# Patient Record
Sex: Male | Born: 1935 | Race: White | Hispanic: No | Smoking: Never smoker
Health system: Southern US, Community
[De-identification: ages and names within clinical notes are randomized; demographics above are authoritative.]

## PROBLEM LIST (undated history)

## (undated) DIAGNOSIS — K5900 Constipation, unspecified: Secondary | ICD-10-CM

## (undated) DIAGNOSIS — T4145XA Adverse effect of unspecified anesthetic, initial encounter: Secondary | ICD-10-CM

## (undated) DIAGNOSIS — K4090 Unilateral inguinal hernia, without obstruction or gangrene, not specified as recurrent: Secondary | ICD-10-CM

## (undated) DIAGNOSIS — D649 Anemia, unspecified: Secondary | ICD-10-CM

## (undated) DIAGNOSIS — N289 Disorder of kidney and ureter, unspecified: Secondary | ICD-10-CM

## (undated) DIAGNOSIS — H409 Unspecified glaucoma: Secondary | ICD-10-CM

## (undated) DIAGNOSIS — R32 Unspecified urinary incontinence: Secondary | ICD-10-CM

## (undated) DIAGNOSIS — R609 Edema, unspecified: Secondary | ICD-10-CM

## (undated) DIAGNOSIS — T8859XA Other complications of anesthesia, initial encounter: Secondary | ICD-10-CM

## (undated) DIAGNOSIS — I639 Cerebral infarction, unspecified: Secondary | ICD-10-CM

## (undated) DIAGNOSIS — I739 Peripheral vascular disease, unspecified: Secondary | ICD-10-CM

## (undated) DIAGNOSIS — C801 Malignant (primary) neoplasm, unspecified: Secondary | ICD-10-CM

## (undated) HISTORY — PX: PENILE PROSTHESIS IMPLANT: SHX240

## (undated) HISTORY — PX: HERNIA REPAIR: SHX51

## (undated) HISTORY — PX: CAROTID ENDARTERECTOMY: SUR193

## (undated) HISTORY — PX: PROSTATECTOMY: SHX69

---

## 2005-03-21 HISTORY — PX: PENILE PROSTHESIS  REMOVAL: SHX2202

## 2017-01-31 ENCOUNTER — Inpatient Hospital Stay (HOSPITAL_COMMUNITY)
Admission: EM | Admit: 2017-01-31 | Discharge: 2017-02-02 | DRG: 682 | Disposition: A | Discharge status: Hospice | Payer: Medicare Other | Attending: Internal Medicine | Admitting: Internal Medicine

## 2017-01-31 ENCOUNTER — Emergency Department (HOSPITAL_COMMUNITY): Payer: Medicare Other

## 2017-01-31 ENCOUNTER — Inpatient Hospital Stay (HOSPITAL_COMMUNITY): Payer: Medicare Other

## 2017-01-31 ENCOUNTER — Encounter (HOSPITAL_COMMUNITY): Payer: Self-pay | Admitting: Emergency Medicine

## 2017-01-31 DIAGNOSIS — Z515 Encounter for palliative care: Secondary | ICD-10-CM

## 2017-01-31 DIAGNOSIS — E875 Hyperkalemia: Secondary | ICD-10-CM | POA: Diagnosis present

## 2017-01-31 DIAGNOSIS — Z888 Allergy status to other drugs, medicaments and biological substances status: Secondary | ICD-10-CM

## 2017-01-31 DIAGNOSIS — M7989 Other specified soft tissue disorders: Secondary | ICD-10-CM | POA: Diagnosis present

## 2017-01-31 DIAGNOSIS — I69354 Hemiplegia and hemiparesis following cerebral infarction affecting left non-dominant side: Secondary | ICD-10-CM | POA: Diagnosis not present

## 2017-01-31 DIAGNOSIS — D696 Thrombocytopenia, unspecified: Secondary | ICD-10-CM | POA: Diagnosis present

## 2017-01-31 DIAGNOSIS — Z66 Do not resuscitate: Secondary | ICD-10-CM | POA: Diagnosis present

## 2017-01-31 DIAGNOSIS — R600 Localized edema: Secondary | ICD-10-CM | POA: Diagnosis present

## 2017-01-31 DIAGNOSIS — Z7189 Other specified counseling: Secondary | ICD-10-CM

## 2017-01-31 DIAGNOSIS — S2242XD Multiple fractures of ribs, left side, subsequent encounter for fracture with routine healing: Secondary | ICD-10-CM | POA: Diagnosis not present

## 2017-01-31 DIAGNOSIS — N183 Chronic kidney disease, stage 3 (moderate): Secondary | ICD-10-CM | POA: Diagnosis present

## 2017-01-31 DIAGNOSIS — N170 Acute kidney failure with tubular necrosis: Secondary | ICD-10-CM | POA: Diagnosis present

## 2017-01-31 DIAGNOSIS — R402132 Coma scale, eyes open, to sound, at arrival to emergency department: Secondary | ICD-10-CM | POA: Diagnosis present

## 2017-01-31 DIAGNOSIS — I639 Cerebral infarction, unspecified: Secondary | ICD-10-CM | POA: Diagnosis not present

## 2017-01-31 DIAGNOSIS — Z9079 Acquired absence of other genital organ(s): Secondary | ICD-10-CM

## 2017-01-31 DIAGNOSIS — R402242 Coma scale, best verbal response, confused conversation, at arrival to emergency department: Secondary | ICD-10-CM | POA: Diagnosis present

## 2017-01-31 DIAGNOSIS — I6340 Cerebral infarction due to embolism of unspecified cerebral artery: Secondary | ICD-10-CM | POA: Diagnosis present

## 2017-01-31 DIAGNOSIS — H409 Unspecified glaucoma: Secondary | ICD-10-CM | POA: Diagnosis present

## 2017-01-31 DIAGNOSIS — K5900 Constipation, unspecified: Secondary | ICD-10-CM | POA: Diagnosis present

## 2017-01-31 DIAGNOSIS — I129 Hypertensive chronic kidney disease with stage 1 through stage 4 chronic kidney disease, or unspecified chronic kidney disease: Secondary | ICD-10-CM | POA: Diagnosis present

## 2017-01-31 DIAGNOSIS — R402362 Coma scale, best motor response, obeys commands, at arrival to emergency department: Secondary | ICD-10-CM | POA: Diagnosis present

## 2017-01-31 DIAGNOSIS — Z7902 Long term (current) use of antithrombotics/antiplatelets: Secondary | ICD-10-CM

## 2017-01-31 DIAGNOSIS — E785 Hyperlipidemia, unspecified: Secondary | ICD-10-CM | POA: Diagnosis present

## 2017-01-31 DIAGNOSIS — F0390 Unspecified dementia without behavioral disturbance: Secondary | ICD-10-CM | POA: Diagnosis present

## 2017-01-31 DIAGNOSIS — I739 Peripheral vascular disease, unspecified: Secondary | ICD-10-CM | POA: Diagnosis present

## 2017-01-31 DIAGNOSIS — Z885 Allergy status to narcotic agent status: Secondary | ICD-10-CM

## 2017-01-31 DIAGNOSIS — Z8249 Family history of ischemic heart disease and other diseases of the circulatory system: Secondary | ICD-10-CM | POA: Diagnosis not present

## 2017-01-31 DIAGNOSIS — Z8673 Personal history of transient ischemic attack (TIA), and cerebral infarction without residual deficits: Secondary | ICD-10-CM

## 2017-01-31 DIAGNOSIS — Z8546 Personal history of malignant neoplasm of prostate: Secondary | ICD-10-CM | POA: Diagnosis not present

## 2017-01-31 DIAGNOSIS — Z886 Allergy status to analgesic agent status: Secondary | ICD-10-CM

## 2017-01-31 DIAGNOSIS — W19XXXD Unspecified fall, subsequent encounter: Secondary | ICD-10-CM | POA: Diagnosis present

## 2017-01-31 DIAGNOSIS — R944 Abnormal results of kidney function studies: Secondary | ICD-10-CM | POA: Diagnosis present

## 2017-01-31 DIAGNOSIS — I10 Essential (primary) hypertension: Secondary | ICD-10-CM | POA: Diagnosis not present

## 2017-01-31 DIAGNOSIS — N179 Acute kidney failure, unspecified: Secondary | ICD-10-CM | POA: Diagnosis present

## 2017-01-31 HISTORY — DX: Unilateral inguinal hernia, without obstruction or gangrene, not specified as recurrent: K40.90

## 2017-01-31 HISTORY — DX: Constipation, unspecified: K59.00

## 2017-01-31 HISTORY — DX: Unspecified glaucoma: H40.9

## 2017-01-31 HISTORY — DX: Anemia, unspecified: D64.9

## 2017-01-31 HISTORY — DX: Adverse effect of unspecified anesthetic, initial encounter: T41.45XA

## 2017-01-31 HISTORY — DX: Edema, unspecified: R60.9

## 2017-01-31 HISTORY — DX: Unspecified urinary incontinence: R32

## 2017-01-31 HISTORY — DX: Disorder of kidney and ureter, unspecified: N28.9

## 2017-01-31 HISTORY — DX: Cerebral infarction, unspecified: I63.9

## 2017-01-31 HISTORY — DX: Peripheral vascular disease, unspecified: I73.9

## 2017-01-31 HISTORY — DX: Malignant (primary) neoplasm, unspecified: C80.1

## 2017-01-31 HISTORY — DX: Other complications of anesthesia, initial encounter: T88.59XA

## 2017-01-31 LAB — CBC WITH DIFFERENTIAL/PLATELET
Basophils Absolute: 0 10*3/uL (ref 0.0–0.1)
Basophils Relative: 0 %
EOS PCT: 1 %
Eosinophils Absolute: 0.1 10*3/uL (ref 0.0–0.7)
HCT: 39.5 % (ref 39.0–52.0)
Hemoglobin: 13.5 g/dL (ref 13.0–17.0)
LYMPHS ABS: 1 10*3/uL (ref 0.7–4.0)
LYMPHS PCT: 7 %
MCH: 32.7 pg (ref 26.0–34.0)
MCHC: 34.2 g/dL (ref 30.0–36.0)
MCV: 95.6 fL (ref 78.0–100.0)
MONO ABS: 2 10*3/uL — AB (ref 0.1–1.0)
MONOS PCT: 13 %
Neutro Abs: 12.5 10*3/uL — ABNORMAL HIGH (ref 1.7–7.7)
Neutrophils Relative %: 80 %
PLATELETS: 76 10*3/uL — AB (ref 150–400)
RBC: 4.13 MIL/uL — ABNORMAL LOW (ref 4.22–5.81)
RDW: 14.2 % (ref 11.5–15.5)
WBC: 15.6 10*3/uL — ABNORMAL HIGH (ref 4.0–10.5)

## 2017-01-31 LAB — URINALYSIS, ROUTINE W REFLEX MICROSCOPIC
BACTERIA UA: NONE SEEN
BILIRUBIN URINE: NEGATIVE
Glucose, UA: NEGATIVE mg/dL
Ketones, ur: NEGATIVE mg/dL
Leukocytes, UA: NEGATIVE
Nitrite: NEGATIVE
PROTEIN: 100 mg/dL — AB
SPECIFIC GRAVITY, URINE: 1.01 (ref 1.005–1.030)
pH: 7 (ref 5.0–8.0)

## 2017-01-31 LAB — I-STAT CHEM 8, ED
BUN: 118 mg/dL — ABNORMAL HIGH (ref 6–20)
CALCIUM ION: 1.07 mmol/L — AB (ref 1.15–1.40)
CREATININE: 11.1 mg/dL — AB (ref 0.61–1.24)
Chloride: 100 mmol/L — ABNORMAL LOW (ref 101–111)
GLUCOSE: 109 mg/dL — AB (ref 65–99)
HCT: 40 % (ref 39.0–52.0)
HEMOGLOBIN: 13.6 g/dL (ref 13.0–17.0)
Potassium: 6 mmol/L — ABNORMAL HIGH (ref 3.5–5.1)
Sodium: 134 mmol/L — ABNORMAL LOW (ref 135–145)
TCO2: 25 mmol/L (ref 22–32)

## 2017-01-31 LAB — LACTATE DEHYDROGENASE: LDH: 917 U/L — ABNORMAL HIGH (ref 98–192)

## 2017-01-31 LAB — COMPREHENSIVE METABOLIC PANEL
ALT: 36 U/L (ref 17–63)
AST: 45 U/L — AB (ref 15–41)
Albumin: 3.3 g/dL — ABNORMAL LOW (ref 3.5–5.0)
Alkaline Phosphatase: 99 U/L (ref 38–126)
Anion gap: 14 (ref 5–15)
BILIRUBIN TOTAL: 1.9 mg/dL — AB (ref 0.3–1.2)
BUN: 121 mg/dL — AB (ref 6–20)
CHLORIDE: 96 mmol/L — AB (ref 101–111)
CO2: 24 mmol/L (ref 22–32)
Calcium: 8.6 mg/dL — ABNORMAL LOW (ref 8.9–10.3)
Creatinine, Ser: 10.68 mg/dL — ABNORMAL HIGH (ref 0.61–1.24)
GFR calc Af Amer: 4 mL/min — ABNORMAL LOW (ref >60)
GFR calc non Af Amer: 4 mL/min — ABNORMAL LOW (ref >60)
GLUCOSE: 110 mg/dL — AB (ref 65–99)
POTASSIUM: 6.2 mmol/L — AB (ref 3.5–5.1)
Sodium: 134 mmol/L — ABNORMAL LOW (ref 135–145)
Total Protein: 6.4 g/dL — ABNORMAL LOW (ref 6.5–8.1)

## 2017-01-31 LAB — CBG MONITORING, ED: GLUCOSE-CAPILLARY: 69 mg/dL (ref 65–99)

## 2017-01-31 LAB — PROTIME-INR
INR: 1.11
PROTHROMBIN TIME: 14.2 s (ref 11.4–15.2)

## 2017-01-31 LAB — URIC ACID: URIC ACID, SERUM: 8.9 mg/dL — AB (ref 4.4–7.6)

## 2017-01-31 LAB — APTT: APTT: 30 s (ref 24–36)

## 2017-01-31 LAB — CK: CK TOTAL: 268 U/L (ref 49–397)

## 2017-01-31 MED ORDER — TRAVOPROST 0.004 % OP SOLN: 1.0000 [drp] | Freq: Every day | OPHTHALMIC | Status: DC

## 2017-01-31 MED ORDER — ATORVASTATIN CALCIUM 20 MG PO TABS
20.0000 mg | ORAL_TABLET | Freq: Every day | ORAL | Status: DC
  Administered 2017-02-01 – 2017-02-02 (×2): 20 mg via ORAL
  Filled 2017-01-31 (×2): qty 1

## 2017-01-31 MED ORDER — ACETAMINOPHEN 160 MG/5ML PO SOLN: 650.0000 mg | ORAL | Status: DC | PRN

## 2017-01-31 MED ORDER — ACETAMINOPHEN 650 MG RE SUPP: 650.0000 mg | RECTAL | Status: DC | PRN

## 2017-01-31 MED ORDER — SODIUM CHLORIDE 0.9 % IV BOLUS (SEPSIS)
1000.0000 mL | Freq: Once | INTRAVENOUS | Status: AC
  Administered 2017-01-31: 1000 mL via INTRAVENOUS

## 2017-01-31 MED ORDER — ACETAMINOPHEN 325 MG PO TABS
650.0000 mg | ORAL_TABLET | ORAL | Status: DC | PRN
  Administered 2017-02-01: 650 mg via ORAL
  Filled 2017-01-31: qty 2

## 2017-01-31 MED ORDER — EZETIMIBE 10 MG PO TABS
10.0000 mg | ORAL_TABLET | Freq: Every day | ORAL | Status: DC
Start: 2017-02-01 — End: 2017-02-02
  Administered 2017-02-01 – 2017-02-02 (×2): 10 mg via ORAL
  Filled 2017-01-31 (×2): qty 1

## 2017-01-31 MED ORDER — SODIUM CHLORIDE 0.9 % IV SOLN
1.0000 g | Freq: Once | INTRAVENOUS | Status: AC
  Administered 2017-01-31: 1 g via INTRAVENOUS
  Filled 2017-01-31: qty 10

## 2017-01-31 MED ORDER — SODIUM CHLORIDE 0.9 % IV SOLN
INTRAVENOUS | Status: DC
  Administered 2017-02-01 (×2): via INTRAVENOUS

## 2017-01-31 MED ORDER — SODIUM POLYSTYRENE SULFONATE 15 GM/60ML PO SUSP
50.0000 g | Freq: Once | ORAL | Status: AC
  Administered 2017-01-31: 50 g via RECTAL
  Filled 2017-01-31: qty 240

## 2017-01-31 MED ORDER — CLOPIDOGREL BISULFATE 75 MG PO TABS
75.0000 mg | ORAL_TABLET | Freq: Every day | ORAL | Status: DC
  Administered 2017-02-01: 75 mg via ORAL
  Filled 2017-01-31: qty 1

## 2017-01-31 MED ORDER — DEXTROSE 50 % IV SOLN
50.0000 mL | Freq: Once | INTRAVENOUS | Status: AC
  Administered 2017-01-31: 50 mL via INTRAVENOUS
  Filled 2017-01-31: qty 50

## 2017-01-31 MED ORDER — AMLODIPINE BESYLATE 5 MG PO TABS
2.5000 mg | ORAL_TABLET | Freq: Every day | ORAL | Status: DC
  Administered 2017-02-01 – 2017-02-02 (×2): 2.5 mg via ORAL
  Filled 2017-01-31 (×2): qty 1

## 2017-01-31 NOTE — ED Notes (Signed)
Call report to [facility]  [phone] at [phone]

## 2017-01-31 NOTE — Consult Note (Signed)
Reason for Consult:AKI Referring Physician: Dr. Annett Fabian Sr. is an 83 y.o. male.  HPI: 83 yr male with long hx HTN, Hx prostate Ca, CVA 3.18, glaucoma, incontinece, dementia who lives at [facility] in skilled care.  Baseline Cr 3/18 1.4s.  MEDS?? List not available and son does not know or the patient know.  Fall on Sat and seen in [facility] ED and released.  Apparently weak since, not walking with usual walker. Son says eating fine, maybe a little more confusion.  No N, V, shaking,poor intake, rash, or arthritis pills. By reports more confused so brought to ED earlier.  Found to have Cr 11/BUN 118/K of 6 /normal bicarb. No FH renal dz, no blood in urine known.  CT without obstruction, has foley with no urine.  Stranding around kidneys. Review of systems not obtained due to patient factors. Dementia  Past Medical History:  Diagnosis Date  . Anemia   . Cancer [facility])    Prostate  . Complication of anesthesia    trouble with anethesia when had prostate ca surgery - problem unknown  . Constipation   . Edema   . Glaucoma   . Incontinence   . Inguinal hernia   . Peripheral vascular disease ([place])   . Renal disorder    Stage III  . Stroke [facility])    left sided weakness    Past Surgical History:  Procedure Laterality Date  . CAROTID ENDARTERECTOMY Left approx. 2007  . HERNIA REPAIR  approx. 1996   inguinal hernia  . PENILE PROSTHESIS  REMOVAL  2007  . PENILE PROSTHESIS IMPLANT  approx. 1979  . PROSTATECTOMY  approx. 1971    History reviewed. No pertinent family history.  Social History:  reports that  has never smoked. he has never used smokeless tobacco. He reports that he does not drink alcohol or use drugs.  Allergies:  Allergies  Allergen Reactions  . Demerol [Meperidine]   . Nsaids   . Simvastatin   . Statins     Medications: not available  Results for orders placed or performed during the hospital encounter of 01/31/17 (from the past 48 hour(s))   Comprehensive metabolic panel     Status: Abnormal   Collection Time: 01/31/17  7:09 PM  Result Value Ref Range   Sodium 134 (L) 135 - 145 mmol/L   Potassium 6.2 (H) 3.5 - 5.1 mmol/L   Chloride 96 (L) 101 - 111 mmol/L   CO2 24 22 - 32 mmol/L   Glucose, Bld 110 (H) 65 - 99 mg/dL   BUN 121 (H) 6 - 20 mg/dL    Comment: RESULTS CONFIRMED BY MANUAL DILUTION   Creatinine, Ser 10.68 (H) 0.61 - 1.24 mg/dL   Calcium 8.6 (L) 8.9 - 10.3 mg/dL   Total Protein 6.4 (L) 6.5 - 8.1 g/dL   Albumin 3.3 (L) 3.5 - 5.0 g/dL   AST 45 (H) 15 - 41 U/L   ALT 36 17 - 63 U/L   Alkaline Phosphatase 99 38 - 126 U/L   Total Bilirubin 1.9 (H) 0.3 - 1.2 mg/dL   GFR calc non Af Amer 4 (L) >60 mL/min   GFR calc Af Amer 4 (L) >60 mL/min    Comment: (NOTE) The eGFR has been calculated using the CKD EPI equation. This calculation has not been validated in all clinical situations. eGFR's persistently <60 mL/min signify possible Chronic Kidney Disease.    Anion gap 14 5 - 15  CBC with Differential  Status: Abnormal   Collection Time: 01/31/17  7:09 PM  Result Value Ref Range   WBC 15.6 (H) 4.0 - 10.5 K/uL   RBC 4.13 (L) 4.22 - 5.81 MIL/uL   Hemoglobin 13.5 13.0 - 17.0 g/dL   HCT 39.5 39.0 - 52.0 %   MCV 95.6 78.0 - 100.0 fL   MCH 32.7 26.0 - 34.0 pg   MCHC 34.2 30.0 - 36.0 g/dL   RDW 14.2 11.5 - 15.5 %   Platelets 76 (L) 150 - 400 K/uL    Comment: REPEATED TO VERIFY SPECIMEN CHECKED FOR CLOTS PLATELET COUNT CONFIRMED BY SMEAR    Neutrophils Relative % 80 %   Neutro Abs 12.5 (H) 1.7 - 7.7 K/uL   Lymphocytes Relative 7 %   Lymphs Abs 1.0 0.7 - 4.0 K/uL   Monocytes Relative 13 %   Monocytes Absolute 2.0 (H) 0.1 - 1.0 K/uL   Eosinophils Relative 1 %   Eosinophils Absolute 0.1 0.0 - 0.7 K/uL   Basophils Relative 0 %   Basophils Absolute 0.0 0.0 - 0.1 K/uL  Urinalysis, Routine w reflex microscopic     Status: Abnormal   Collection Time: 01/31/17  7:13 PM  Result Value Ref Range   Color, Urine YELLOW  YELLOW   APPearance CLEAR CLEAR   Specific Gravity, Urine 1.010 1.005 - 1.030   pH 7.0 5.0 - 8.0   Glucose, UA NEGATIVE NEGATIVE mg/dL   Hgb urine dipstick LARGE (A) NEGATIVE   Bilirubin Urine NEGATIVE NEGATIVE   Ketones, ur NEGATIVE NEGATIVE mg/dL   Protein, ur 100 (A) NEGATIVE mg/dL   Nitrite NEGATIVE NEGATIVE   Leukocytes, UA NEGATIVE NEGATIVE   RBC / HPF TOO NUMEROUS TO COUNT 0 - 5 RBC/hpf   WBC, UA 6-30 0 - 5 WBC/hpf   Bacteria, UA NONE SEEN NONE SEEN   Squamous Epithelial / LPF 0-5 (A) NONE SEEN   Mucus PRESENT    Ca Oxalate Crys, UA PRESENT   I-stat Chem 8, ED     Status: Abnormal   Collection Time: 01/31/17  7:50 PM  Result Value Ref Range   Sodium 134 (L) 135 - 145 mmol/L   Potassium 6.0 (H) 3.5 - 5.1 mmol/L   Chloride 100 (L) 101 - 111 mmol/L   BUN 118 (H) 6 - 20 mg/dL   Creatinine, Ser 11.10 (H) 0.61 - 1.24 mg/dL   Glucose, Bld 109 (H) 65 - 99 mg/dL   Calcium, Ion 1.07 (L) 1.15 - 1.40 mmol/L   TCO2 25 22 - 32 mmol/L   Hemoglobin 13.6 13.0 - 17.0 g/dL   HCT 40.0 39.0 - 52.0 %  CBG monitoring, ED     Status: None   Collection Time: 01/31/17  8:31 PM  Result Value Ref Range   Glucose-Capillary 69 65 - 99 mg/dL  CK     Status: None   Collection Time: 01/31/17  8:46 PM  Result Value Ref Range   Total CK 268 49 - 397 U/L    Ct Abdomen Pelvis Wo Contrast  Result Date: 01/31/2017 CLINICAL DATA:  Patient status post fall on Saturday was diagnosed with kidney contusion. EXAM: CT ABDOMEN AND PELVIS WITHOUT CONTRAST TECHNIQUE: Multidetector CT imaging of the abdomen and pelvis was performed following the standard protocol without IV contrast. COMPARISON:  None. FINDINGS: Lower chest: Mild atelectasis of posterior lung bases are noted. The heart size is mildly enlarged. Hepatobiliary: No focal liver abnormality is seen. No gallstones, gallbladder wall thickening, or biliary dilatation. Pancreas:  There is a question low-density lesion measuring 1.3 cm in the tail of pancreas.  Pancreas is otherwise unremarkable. Spleen: Normal in size without focal abnormality. Adrenals/Urinary Tract: The adrenal glands are normal. No focal lesions identified in the kidneys. There is no hydronephrosis bilaterally. Mild bilateral perinephric stranding are identified probably chronic. No definite focal hematoma is identified surrounding the kidneys. The bladder is decompressed with a Foley catheter in place. Stomach/Bowel: There is a moderate hiatal hernia. The stomach is otherwise normal. There is no small bowel obstruction. The colon is normal. The appendix is normal. Vascular/Lymphatic: Aortic atherosclerosis. No enlarged abdominal or pelvic lymph nodes. Reproductive: Prostate is unremarkable. Other: None. Musculoskeletal: Degenerative joint changes of the spine are noted. Scoliosis of spine are noted. Chronic deformity of the pelvic bones are identified. IMPRESSION: No acute abnormality identified in the abdomen pelvis. There is no evidence of hematoma surrounding the bilateral kidneys. Mild chronic bilateral prior nephric stranding are noted. Question 1.3 cm low-density lesion in the tail pancreas. Moderate hiatal hernia. Electronically Signed   By: Abelardo Diesel M.D.   On: 01/31/2017 20:19   Dg Ribs Unilateral W/chest Left  Result Date: 01/31/2017 CLINICAL DATA:  Altered mental status after a fall 3 days ago. Left-sided chest pain. EXAM: LEFT RIBS AND CHEST - 3+ VIEW COMPARISON:  None. FINDINGS: Left ventricular prominence. Aortic atherosclerosis with calcification and tortuosity. No evidence of heart failure, infiltrate, collapse or effusion. No pneumothorax. Left-sided rib films show what appear to be old healed fractures at ribs 4, 5, 6, 7 and 8 posteriorly. Cannot completely rule out a more recent injury, but largely these look old. No definite acute fracture. IMPRESSION: Burtis Junes old healed rib fractures on the left from ribs 4 through 8. Difficult to completely rule out acute injury, but favor  that these are old. Electronically Signed   By: Nelson Chimes M.D.   On: 01/31/2017 20:14   Ct Head Wo Contrast  Result Date: 01/31/2017 CLINICAL DATA:  Status post fall on Saturday with altered mental status. EXAM: CT HEAD WITHOUT CONTRAST TECHNIQUE: Contiguous axial images were obtained from the base of the skull through the vertex without intravenous contrast. COMPARISON:  None. FINDINGS: Brain: No evidence of acute infarction, hemorrhage, hydrocephalus, extra-axial collection or mass lesion/mass effect. There is chronic diffuse atrophy. Chronic bilateral periventricular white matter small vessel ischemic changes identified. There are old infarcts in bilateral basal ganglia, right occipital lobe, and right cerebellum. Vascular: No hyperdense vessel or unexpected calcification. Skull: Normal. Negative for fracture or focal lesion. Sinuses/Orbits: There is small left maxillary sinus retention cyst. The orbits are normal. Other: None. IMPRESSION: No focal acute intracranial abnormality identified. Old infarcts in bilateral basal ganglia, right occipital lobe, and right cerebellum. Chronic diffuse atrophy. Chronic bilateral periventricular white matter small vessel ischemic changes. Electronically Signed   By: Abelardo Diesel M.D.   On: 01/31/2017 20:10    ROS Blood pressure (!) 179/115, pulse 76, temperature 98.7 F (37.1 C), temperature source Rectal, resp. rate 19, SpO2 94 %. Physical Exam Physical Examination: General appearance - pale, tremulous Mental status - oriented to person, not place or time, conversant and knows some hx Eyes - pupils equal and reactive, extraocular eye movements intact, funduscopic exam normal, discs flat and sharp Ears - bilateral TM's and external ear canals normal Mouth - mucous membranes moist, pharynx normal without lesions Neck - adenopathy noted PCL Lymphatics - posterior cervical nodes Chest - scattered rhonchi Heart - S1 and S2 normal, systolic murmur CX4/4 at  2nd left intercostal space Abdomen - pos bs, bruise on L , liver down 4 cm, soft Extremities - support stocking L leg, DP 1+, Tr edema,  Skin - normal coloration and turgor, no rashes, no suspicious skin lesions noted  Assessment/Plan: 1 AKI suspect subacute.  No obstruct on CT but ??prostate Ca related.  Most prominent in differential dx, toxic related to meds, AIN, AGN, or TMA (low platelets), or dysproteinemia.  No clear cut reason for ATN.  Need U/S, serologies, Protein electrophoreses, Coags, LDH, .  Mod uremia, ^ K , acid/base ok.  Son not sure wants RRT at all, and wants conservative measures while considers. Not candidate long term at all. 2 CKD 3 suspect HTN 3 Hypertension: mod 4. Anemia not an issue 5. Metabolic Bone Disease no indic to eval 6 CVA with L sided weakness chronic 7 Dementia P IV NS 100cc/h, Urine chem, Protein electrophoreses, serologies, coags, LDH, Kayex  Raney Antwine,Juma L 01/31/2017, 10:04 PM

## 2017-01-31 NOTE — ED Notes (Signed)
Bladder scan done zero amount see. M.d aware and try with u/s and it was very difficult to see urine.

## 2017-01-31 NOTE — ED Notes (Signed)
Ultrasound on going. 

## 2017-01-31 NOTE — H&P (Signed)
History and Physical    George Venable Sr. ZOX:[id number] DOB: 01-09-1922 DOA: 01/31/2017  PCP: Patient, No Pcp Per  Patient coming from: Skilled nursing facility.  Chief Complaint: Abnormal labs.  HPI: George Benningfield Sr. is a 83 y.o. male with history of hypertension, prostate cancer, stroke in March of this year which left patient with mild left-sided weakness and at that time follow-up with cardiologist showed unremarkable echocardiogram had a fall 2 days ago and was taken to [facility].  As per the son who provided most of the history CT head was done and discharged back to skilled nursing facility.  Since discharge patient has been feeling the and also has been having more weakness on the left upper extremity than usual.  Labs were done in the nursing home which showed markedly increased creatinine of 9.9 and patient's creatinine was 1.6 in March of this year.  Patient was referred to the ER.  ED Course: In the ER patient's repeat labs show elevated creatinine 10.6 with peaked T waves and hyperkalemia of 6.2.  Platelets were also decreased at around 70.  Patient was alert awake and following commands.  X-ray of the ribs show no fracture on 428 which is age-indeterminate.  CT of the abdomen does not show any obstruction.  CT head was unremarkable.  On exam patient does have left upper extremity weakness.  Nephrologist on-call was consulted.  Patient was given fluid bolus.  For hyperkalemia patient was given calcium gluconate Kayexalate.  Review of Systems: As per HPI, rest all negative.   Past Medical History:  Diagnosis Date  . Anemia   . Cancer [facility]    Prostate  . Complication of anesthesia    trouble with anethesia when had prostate ca surgery - problem unknown  . Constipation   . Edema   . Glaucoma   . Incontinence   . Inguinal hernia   . Peripheral vascular disease ([place])   . Renal disorder    Stage III  . Stroke [facility])    left sided weakness     Past Surgical History:  Procedure Laterality Date  . CAROTID ENDARTERECTOMY Left approx. 2007  . HERNIA REPAIR  approx. 1996   inguinal hernia  . PENILE PROSTHESIS  REMOVAL  2007  . PENILE PROSTHESIS IMPLANT  approx. 1979  . PROSTATECTOMY  approx. 1971     reports that  has never smoked. he has never used smokeless tobacco. He reports that he does not drink alcohol or use drugs.  Allergies  Allergen Reactions  . Demerol [Meperidine]   . Nsaids   . Simvastatin   . Statins     Family History  Problem Relation Age of Onset  . Hypertension Other     Prior to Admission medications   Medication Sig Start Date End Date Taking? Authorizing Provider  acetaminophen (TYLENOL) 325 MG tablet Take 650 mg every 4 (four) hours as needed by mouth for fever.    Yes [provider]  amLODipine (NORVASC) 2.5 MG tablet Take 2.5 mg daily by mouth.    Yes [provider]  ascorbic acid (VITAMIN C) 250 MG tablet Take 250 mg daily by mouth.    Yes [provider]  atorvastatin (LIPITOR) 20 MG tablet Take 20 mg daily by mouth.   Yes [provider]  calcium carbonate (OSCAL) 1500 (600 Ca) MG TABS tablet Take 1,500 mg daily with breakfast by mouth.   Yes [provider]  clopidogrel (PLAVIX) 75  MG tablet Take 75 mg daily by mouth.  05/27/16  Yes [provider]  Coenzyme Q-10 200 MG CAPS Take 200 mg daily by mouth.    Yes [provider]  ezetimibe (ZETIA) 10 MG tablet Take 10 mg by mouth. 02/18/16  Yes [provider]  gabapentin (NEURONTIN) 300 MG capsule Take 300-600 mg 2 (two) times daily by mouth. Take 2 capsules (600 mg) by mouth at midday and Take 1 capsule (300 mg) at bedtime   Yes [provider]  Nyoka Cowden Tea 150 MG CAPS Take 315 mg daily by mouth.    Yes [provider]  Multiple Vitamin (MULTI-VITAMINS) TABS Take 1 tablet daily by mouth.  01/18/16  Yes [provider]  polyethylene glycol  (MIRALAX / GLYCOLAX) packet Take 17 g daily by mouth.    Yes [provider]  primidone (MYSOLINE) 50 MG tablet Take 50 mg 2 (two) times daily by mouth.    Yes [provider]  sodium chloride (OCEAN) 0.65 % nasal spray 1 spray by Right Nare route as needed for congestion.   Yes [provider]  travoprost, benzalkonium, (TRAVATAN) 0.004 % ophthalmic solution Administer 1 drop to both eyes nightly.   Yes [provider]  zinc oxide 20 % ointment Apply 1 application 2 (two) times daily as needed topically for irritation.    Yes [provider]    Physical Exam: Vitals:   01/31/17 2009 01/31/17 2030 01/31/17 2048 01/31/17 2258  BP: (!) 197/97 (!) 179/115    Pulse: 76 76    Resp: 19     Temp:   98.7 F (37.1 C) 98.2 F (36.8 C)  TempSrc:   Rectal   SpO2: 92% 94%        Constitutional: Moderately built and nourished. Vitals:   01/31/17 2009 01/31/17 2030 01/31/17 2048 01/31/17 2258  BP: (!) 197/97 (!) 179/115    Pulse: 76 76    Resp: 19     Temp:   98.7 F (37.1 C) 98.2 F (36.8 C)  TempSrc:   Rectal   SpO2: 92% 94%     Eyes: Anicteric no pallor. ENMT: No discharge from the ears eyes nose or mouth. Neck: No mass felt.  No neck rigidity. Respiratory: No rhonchi or crepitations. Cardiovascular: S1-S2 heard no murmurs appreciated. Abdomen: Soft nontender bowel sounds present. Musculoskeletal: No edema.  No joint effusion. Skin: No rash.  Skin appears warm. Neurologic: Alert awake oriented to his name and place.  Moves all extremities with weakness in the left upper extremity about 3 x 5.  Rest of the extremities are 5 x 5.  No facial asymmetry.  Tongue is midline.  No facial asymmetry.  Pupils are reacting to light. Psychiatric: Mildly confused.   Labs on Admission: I have personally reviewed following labs and imaging studies  CBC: Recent Labs  Lab 01/31/17 1909 01/31/17 1950  WBC 15.6*  --   NEUTROABS 12.5*  --   HGB 13.5  13.6  HCT 39.5 40.0  MCV 95.6  --   PLT 76*  --    Basic Metabolic Panel: Recent Labs  Lab 01/31/17 1909 01/31/17 1950  NA 134* 134*  K 6.2* 6.0*  CL 96* 100*  CO2 24  --   GLUCOSE 110* 109*  BUN 121* 118*  CREATININE 10.68* 11.10*  CALCIUM 8.6*  --    GFR: CrCl cannot be calculated (Unknown ideal weight.). Liver Function Tests: Recent Labs  Lab 01/31/17 1909  AST  45*  ALT 36  ALKPHOS 99  BILITOT 1.9*  PROT 6.4*  ALBUMIN 3.3*   No results for input(s): LIPASE, AMYLASE in the last 168 hours. No results for input(s): AMMONIA in the last 168 hours. Coagulation Profile: Recent Labs  Lab 01/31/17 2245  INR 1.11   Cardiac Enzymes: Recent Labs  Lab 01/31/17 2046  CKTOTAL 268   BNP (last 3 results) No results for input(s): PROBNP in the last 8760 hours. HbA1C: No results for input(s): HGBA1C in the last 72 hours. CBG: Recent Labs  Lab 01/31/17 2031  GLUCAP 69   Lipid Profile: No results for input(s): CHOL, HDL, LDLCALC, TRIG, CHOLHDL, LDLDIRECT in the last 72 hours. Thyroid Function Tests: No results for input(s): TSH, T4TOTAL, FREET4, T3FREE, THYROIDAB in the last 72 hours. Anemia Panel: No results for input(s): VITAMINB12, FOLATE, FERRITIN, TIBC, IRON, RETICCTPCT in the last 72 hours. Urine analysis:    Component Value Date/Time   COLORURINE YELLOW 01/31/2017 1913   APPEARANCEUR CLEAR 01/31/2017 1913   LABSPEC 1.010 01/31/2017 1913   PHURINE 7.0 01/31/2017 1913   GLUCOSEU NEGATIVE 01/31/2017 1913   HGBUR LARGE (A) 01/31/2017 [place] 01/31/2017 [place] 01/31/2017 1913   PROTEINUR 100 (A) 01/31/2017 1913   NITRITE NEGATIVE 01/31/2017 [place] 01/31/2017 1913   Sepsis Labs: @LABRCNTIP (procalcitonin:4,lacticidven:4) )No results found for this or any previous visit (from the past 240 hour(s)).   Radiological Exams on Admission: Ct Abdomen Pelvis Wo Contrast  Result Date:  01/31/2017 CLINICAL DATA:  Patient status post fall on Saturday was diagnosed with kidney contusion. EXAM: CT ABDOMEN AND PELVIS WITHOUT CONTRAST TECHNIQUE: Multidetector CT imaging of the abdomen and pelvis was performed following the standard protocol without IV contrast. COMPARISON:  None. FINDINGS: Lower chest: Mild atelectasis of posterior lung bases are noted. The heart size is mildly enlarged. Hepatobiliary: No focal liver abnormality is seen. No gallstones, gallbladder wall thickening, or biliary dilatation. Pancreas: There is a question low-density lesion measuring 1.3 cm in the tail of pancreas. Pancreas is otherwise unremarkable. Spleen: Normal in size without focal abnormality. Adrenals/Urinary Tract: The adrenal glands are normal. No focal lesions identified in the kidneys. There is no hydronephrosis bilaterally. Mild bilateral perinephric stranding are identified probably chronic. No definite focal hematoma is identified surrounding the kidneys. The bladder is decompressed with a Foley catheter in place. Stomach/Bowel: There is a moderate hiatal hernia. The stomach is otherwise normal. There is no small bowel obstruction. The colon is normal. The appendix is normal. Vascular/Lymphatic: Aortic atherosclerosis. No enlarged abdominal or pelvic lymph nodes. Reproductive: Prostate is unremarkable. Other: None. Musculoskeletal: Degenerative joint changes of the spine are noted. Scoliosis of spine are noted. Chronic deformity of the pelvic bones are identified. IMPRESSION: No acute abnormality identified in the abdomen pelvis. There is no evidence of hematoma surrounding the bilateral kidneys. Mild chronic bilateral prior nephric stranding are noted. Question 1.3 cm low-density lesion in the tail pancreas. Moderate hiatal hernia. Electronically Signed   By: Abelardo Diesel M.D.   On: 01/31/2017 20:19   Dg Ribs Unilateral W/chest Left  Result Date: 01/31/2017 CLINICAL DATA:  Altered mental status after a  fall 3 days ago. Left-sided chest pain. EXAM: LEFT RIBS AND CHEST - 3+ VIEW COMPARISON:  None. FINDINGS: Left ventricular prominence. Aortic atherosclerosis with calcification and tortuosity. No evidence of heart failure, infiltrate, collapse or effusion. No pneumothorax. Left-sided rib films show what appear to be old healed fractures at ribs 4,  5, 6, 7 and 8 posteriorly. Cannot completely rule out a more recent injury, but largely these look old. No definite acute fracture. IMPRESSION: Burtis Junes old healed rib fractures on the left from ribs 4 through 8. Difficult to completely rule out acute injury, but favor that these are old. Electronically Signed   By: Nelson Chimes M.D.   On: 01/31/2017 20:14   Ct Head Wo Contrast  Result Date: 01/31/2017 CLINICAL DATA:  Status post fall on Saturday with altered mental status. EXAM: CT HEAD WITHOUT CONTRAST TECHNIQUE: Contiguous axial images were obtained from the base of the skull through the vertex without intravenous contrast. COMPARISON:  None. FINDINGS: Brain: No evidence of acute infarction, hemorrhage, hydrocephalus, extra-axial collection or mass lesion/mass effect. There is chronic diffuse atrophy. Chronic bilateral periventricular white matter small vessel ischemic changes identified. There are old infarcts in bilateral basal ganglia, right occipital lobe, and right cerebellum. Vascular: No hyperdense vessel or unexpected calcification. Skull: Normal. Negative for fracture or focal lesion. Sinuses/Orbits: There is small left maxillary sinus retention cyst. The orbits are normal. Other: None. IMPRESSION: No focal acute intracranial abnormality identified. Old infarcts in bilateral basal ganglia, right occipital lobe, and right cerebellum. Chronic diffuse atrophy. Chronic bilateral periventricular white matter small vessel ischemic changes. Electronically Signed   By: Abelardo Diesel M.D.   On: 01/31/2017 20:10    EKG: Independently reviewed. Normal sinus rhythm  with peaked T waves.  Assessment/Plan Principal Problem:   Acute renal failure (ARF) (HCC) Active Problems:   Hyperkalemia   Essential hypertension   Thrombocytopenia (HCC)   History of stroke   ARF (acute renal failure) ([place])    1. Acute renal failure on chronic kidney disease -cause of which is not clear.  Appreciate nephrology consult.  Patient has received Kayexalate calcium gluconate D50 for his hyperkalemia in the setting of acute renal failure.  Patient also received fluid bolus and will be continued on normal saline 100/h.  CT scan does not show any obstruction of the renal system.  Closely follow intake output metabolic panel.  Further recommendations per nephrologist. 2. Thrombocytopenia -appears to be new.  Check LDH for any hemolytic process.  Follow CBC. 3. Left-sided weakness with previous stroke -patient has passed stroke swallow.  Will check MRI of brain.  Further recommendation will be based on MRI brain. 4. Hypertension we will keep patient on as needed IV hydralazine along with amlodipine. 5. Left-sided rib fracture with recent fall.  May discuss with trauma in the morning.  DVT prophylaxis: SCDs since patient has thrombocytopenia. Code Status: DNR. Family Communication: Patient's son. Disposition Plan: Skilled nursing facility. Consults called: Nephrology. Admission status:    Rise Patience MD [facility] Pager [phone].  If 7PM-7AM, please contact night-coverage [email or web address] Password [email or web address]  01/31/2017, 11:21 PM

## 2017-01-31 NOTE — ED Provider Notes (Signed)
[facility] DEPT Provider Note   CSN: [id number] Arrival date & time: 01/31/17  1835   LEVEL 5 CAVEAT - DEMENTIA  History   Chief Complaint Chief Complaint  Patient presents with  . Altered Mental Status  . Chronic Kidney Disease    HPI George Garden Sr. is a 83 y.o. male.  HPI  82 year old male sent in by NH for elevated Cr/BUN. Son provides history. Patient fell 3 days ago, had a negative CT in [facility] ER. Sent back to facility. 2 days ago son felt like patient's left side was weak. He fell on his left side and so it could be pain related. No other areas such as ribs/chest were imaged. Patient is on plavix. Since then has been weak, no energy. Asked for wheelchair instead of using walker. Has not been altered from baseline (has dementia) per son. No vomiting. Some mid-lower abdominal pain. Patient states he feels weak but denies other complaints when asked. Chronic LLE swelling. Labs drawn yesterday with results today that show Cr 9.33, BUN 104. K 5.3. Has history of stage 3 kidney disease. Has DNR.  Past Medical History:  Diagnosis Date  . Anemia   . Cancer [facility])    Prostate  . Complication of anesthesia    trouble with anethesia when had prostate ca surgery - problem unknown  . Constipation   . Edema   . Glaucoma   . Incontinence   . Inguinal hernia   . Peripheral vascular disease ([place])   . Renal disorder    Stage III  . Stroke [facility])    left sided weakness    Patient Active Problem List   Diagnosis Date Noted  . Acute renal failure (ARF) ([place]) 01/31/2017  . Hyperkalemia 01/31/2017  . Essential hypertension 01/31/2017  . Thrombocytopenia ([place]) 01/31/2017  . History of stroke 01/31/2017  . ARF (acute renal failure) ([place]) 01/31/2017    Past Surgical History:  Procedure Laterality Date  . CAROTID ENDARTERECTOMY Left approx. 2007  . HERNIA REPAIR  approx. 1996   inguinal hernia  . PENILE PROSTHESIS  REMOVAL  2007  . PENILE  PROSTHESIS IMPLANT  approx. 1979  . PROSTATECTOMY  approx. 1971       Home Medications    Prior to Admission medications   Medication Sig Start Date End Date Taking? Authorizing Provider  acetaminophen (TYLENOL) 325 MG tablet Take 650 mg every 4 (four) hours as needed by mouth for fever.    Yes [provider]  amLODipine (NORVASC) 2.5 MG tablet Take 2.5 mg daily by mouth.    Yes [provider]  ascorbic acid (VITAMIN C) 250 MG tablet Take 250 mg daily by mouth.    Yes [provider]  atorvastatin (LIPITOR) 20 MG tablet Take 20 mg daily by mouth.   Yes [provider]  calcium carbonate (OSCAL) 1500 (600 Ca) MG TABS tablet Take 1,500 mg daily with breakfast by mouth.   Yes [provider]  clopidogrel (PLAVIX) 75 MG tablet Take 75 mg daily by mouth.  05/27/16  Yes [provider]  Coenzyme Q-10 200 MG CAPS Take 200 mg daily by mouth.    Yes [provider]  ezetimibe (ZETIA) 10 MG tablet Take 10 mg by mouth. 02/18/16  Yes [provider]  gabapentin (NEURONTIN) 300 MG capsule Take 300-600 mg 2 (two) times daily by mouth. Take 2 capsules (600 mg) by mouth at midday and Take 1 capsule (300 mg) at  bedtime   Yes [provider]  Nyoka Cowden Tea 150 MG CAPS Take 315 mg daily by mouth.    Yes [provider]  Multiple Vitamin (MULTI-VITAMINS) TABS Take 1 tablet daily by mouth.  01/18/16  Yes [provider]  polyethylene glycol (MIRALAX / GLYCOLAX) packet Take 17 g daily by mouth.    Yes [provider]  primidone (MYSOLINE) 50 MG tablet Take 50 mg 2 (two) times daily by mouth.    Yes [provider]  sodium chloride (OCEAN) 0.65 % nasal spray 1 spray by Right Nare route as needed for congestion.   Yes [provider]  travoprost, benzalkonium, (TRAVATAN) 0.004 % ophthalmic solution Administer 1 drop to both eyes nightly.   Yes [provider]  zinc oxide 20 % ointment  Apply 1 application 2 (two) times daily as needed topically for irritation.    Yes [provider]    Family History Family History  Problem Relation Age of Onset  . Hypertension Other     Social History Social History   Tobacco Use  . Smoking status: Never Smoker  . Smokeless tobacco: Never Used  Substance Use Topics  . Alcohol use: No    Frequency: Never  . Drug use: No     Allergies   Demerol [meperidine]; Nsaids; Simvastatin; and Statins   Review of Systems Review of Systems  Unable to perform ROS: Dementia     Physical Exam Updated Vital Signs BP (!) 183/165 (BP Location: Left Arm)   Pulse 83   Temp 98.2 F (36.8 C)   Resp 18   SpO2 100%   Physical Exam  Constitutional: He appears well-developed and well-nourished.  HENT:  Head: Normocephalic and atraumatic.  Right Ear: External ear normal.  Left Ear: External ear normal.  Nose: Nose normal.  Eyes: Right eye exhibits no discharge. Left eye exhibits no discharge.  Neck: Neck supple.  Cardiovascular: Normal rate, regular rhythm and normal heart sounds.  Pulmonary/Chest: Effort normal and breath sounds normal. He exhibits tenderness.  Left sided chest tenderness and ecchymosis  Abdominal: Soft. There is tenderness (lower abdominal).  Small ecchymosis to left lower abdomen  Musculoskeletal: He exhibits no edema.  Neurological: He is alert. He is disoriented. GCS eye subscore is 3. GCS verbal subscore is 4. GCS motor subscore is 6.  Awake, alert, disoriented. 5/5 strength RUE, 4/5 LUE. 4/5 RLE, LLE  Skin: Skin is warm and dry.  Nursing note and vitals reviewed.    ED Treatments / Results  Labs (all labs ordered are listed, but only abnormal results are displayed) Labs Reviewed  COMPREHENSIVE METABOLIC PANEL - Abnormal; Notable for the following components:      Result Value   Sodium 134 (*)    Potassium 6.2 (*)    Chloride 96 (*)    Glucose, Bld 110 (*)    BUN 121 (*)    Creatinine,  Ser 10.68 (*)    Calcium 8.6 (*)    Total Protein 6.4 (*)    Albumin 3.3 (*)    AST 45 (*)    Total Bilirubin 1.9 (*)    GFR calc non Af Amer 4 (*)    GFR calc Af Amer 4 (*)    All other components within normal limits  CBC WITH DIFFERENTIAL/PLATELET - Abnormal; Notable for the following components:   WBC 15.6 (*)    RBC 4.13 (*)    Platelets 76 (*)    Neutro Abs 12.5 (*)  Monocytes Absolute 2.0 (*)    All other components within normal limits  URINALYSIS, ROUTINE W REFLEX MICROSCOPIC - Abnormal; Notable for the following components:   Hgb urine dipstick LARGE (*)    Protein, ur 100 (*)    Squamous Epithelial / LPF 0-5 (*)    All other components within normal limits  URIC ACID - Abnormal; Notable for the following components:   Uric Acid, Serum 8.9 (*)    All other components within normal limits  LACTATE DEHYDROGENASE - Abnormal; Notable for the following components:   LDH 917 (*)    All other components within normal limits  I-STAT CHEM 8, ED - Abnormal; Notable for the following components:   Sodium 134 (*)    Potassium 6.0 (*)    Chloride 100 (*)    BUN 118 (*)    Creatinine, Ser 11.10 (*)    Glucose, Bld 109 (*)    Calcium, Ion 1.07 (*)    All other components within normal limits  URINE CULTURE  CK  APTT  PROTIME-INR  RENAL FUNCTION PANEL  SODIUM, URINE, RANDOM  CREATININE, URINE, RANDOM  C3 COMPLEMENT  C4 COMPLEMENT  ANTISTREPTOLYSIN O TITER  ANTINUCLEAR ANTIBODIES, IFA  PROTEIN ELECTROPHORESIS, SERUM  KAPPA/LAMBDA LIGHT CHAINS  HAPTOGLOBIN  CBC  CBG MONITORING, ED    EKG  EKG Interpretation  Date/Time:  Tuesday January 31 2017 18:59:25 EST Ventricular Rate:  69 PR Interval:    QRS Duration: 89 QT Interval:  393 QTC Calculation: 421 R Axis:   74 Text Interpretation:  Sinus rhythm mildly peaked T waves No old tracing to compare Confirmed by Sherwood Gambler [phone]) on 01/31/2017 7:10:05 PM       Radiology Ct Abdomen Pelvis Wo  Contrast  Result Date: 01/31/2017 CLINICAL DATA:  Patient status post fall on Saturday was diagnosed with kidney contusion. EXAM: CT ABDOMEN AND PELVIS WITHOUT CONTRAST TECHNIQUE: Multidetector CT imaging of the abdomen and pelvis was performed following the standard protocol without IV contrast. COMPARISON:  None. FINDINGS: Lower chest: Mild atelectasis of posterior lung bases are noted. The heart size is mildly enlarged. Hepatobiliary: No focal liver abnormality is seen. No gallstones, gallbladder wall thickening, or biliary dilatation. Pancreas: There is a question low-density lesion measuring 1.3 cm in the tail of pancreas. Pancreas is otherwise unremarkable. Spleen: Normal in size without focal abnormality. Adrenals/Urinary Tract: The adrenal glands are normal. No focal lesions identified in the kidneys. There is no hydronephrosis bilaterally. Mild bilateral perinephric stranding are identified probably chronic. No definite focal hematoma is identified surrounding the kidneys. The bladder is decompressed with a Foley catheter in place. Stomach/Bowel: There is a moderate hiatal hernia. The stomach is otherwise normal. There is no small bowel obstruction. The colon is normal. The appendix is normal. Vascular/Lymphatic: Aortic atherosclerosis. No enlarged abdominal or pelvic lymph nodes. Reproductive: Prostate is unremarkable. Other: None. Musculoskeletal: Degenerative joint changes of the spine are noted. Scoliosis of spine are noted. Chronic deformity of the pelvic bones are identified. IMPRESSION: No acute abnormality identified in the abdomen pelvis. There is no evidence of hematoma surrounding the bilateral kidneys. Mild chronic bilateral prior nephric stranding are noted. Question 1.3 cm low-density lesion in the tail pancreas. Moderate hiatal hernia. Electronically Signed   By: Abelardo Diesel M.D.   On: 01/31/2017 20:19   Dg Ribs Unilateral W/chest Left  Result Date: 01/31/2017 CLINICAL DATA:   Altered mental status after a fall 3 days ago. Left-sided chest pain. EXAM: LEFT RIBS AND CHEST -  3+ VIEW COMPARISON:  None. FINDINGS: Left ventricular prominence. Aortic atherosclerosis with calcification and tortuosity. No evidence of heart failure, infiltrate, collapse or effusion. No pneumothorax. Left-sided rib films show what appear to be old healed fractures at ribs 4, 5, 6, 7 and 8 posteriorly. Cannot completely rule out a more recent injury, but largely these look old. No definite acute fracture. IMPRESSION: Burtis Junes old healed rib fractures on the left from ribs 4 through 8. Difficult to completely rule out acute injury, but favor that these are old. Electronically Signed   By: Nelson Chimes M.D.   On: 01/31/2017 20:14   Ct Head Wo Contrast  Result Date: 01/31/2017 CLINICAL DATA:  Status post fall on Saturday with altered mental status. EXAM: CT HEAD WITHOUT CONTRAST TECHNIQUE: Contiguous axial images were obtained from the base of the skull through the vertex without intravenous contrast. COMPARISON:  None. FINDINGS: Brain: No evidence of acute infarction, hemorrhage, hydrocephalus, extra-axial collection or mass lesion/mass effect. There is chronic diffuse atrophy. Chronic bilateral periventricular white matter small vessel ischemic changes identified. There are old infarcts in bilateral basal ganglia, right occipital lobe, and right cerebellum. Vascular: No hyperdense vessel or unexpected calcification. Skull: Normal. Negative for fracture or focal lesion. Sinuses/Orbits: There is small left maxillary sinus retention cyst. The orbits are normal. Other: None. IMPRESSION: No focal acute intracranial abnormality identified. Old infarcts in bilateral basal ganglia, right occipital lobe, and right cerebellum. Chronic diffuse atrophy. Chronic bilateral periventricular white matter small vessel ischemic changes. Electronically Signed   By: Abelardo Diesel M.D.   On: 01/31/2017 20:10    Procedures .Critical  Care Performed by: Sherwood Gambler, MD Authorized by: Sherwood Gambler, MD   Critical care provider statement:    Critical care time (minutes):  30   Critical care was necessary to treat or prevent imminent or life-threatening deterioration of the following conditions:  Circulatory failure and renal failure   Critical care was time spent personally by me on the following activities:  Development of treatment plan with patient or surrogate, discussions with consultants, evaluation of patient's response to treatment, examination of patient, ordering and performing treatments and interventions, ordering and review of laboratory studies, ordering and review of radiographic studies, pulse oximetry, re-evaluation of patient's condition and review of old charts   (including critical care time)  Medications Ordered in ED Medications  amLODipine (NORVASC) tablet 2.5 mg (not administered)  atorvastatin (LIPITOR) tablet 20 mg (not administered)  clopidogrel (PLAVIX) tablet 75 mg (not administered)  ezetimibe (ZETIA) tablet 10 mg (not administered)  travoprost (benzalkonium) (TRAVATAN) 0.004 % ophthalmic solution 1 drop (not administered)  0.9 %  sodium chloride infusion (not administered)  acetaminophen (TYLENOL) tablet 650 mg (not administered)    Or  acetaminophen (TYLENOL) solution 650 mg (not administered)    Or  acetaminophen (TYLENOL) suppository 650 mg (not administered)  calcium gluconate 1 g in sodium chloride 0.9 % 100 mL IVPB (0 g Intravenous Stopped 01/31/17 2120)  sodium chloride 0.9 % bolus 1,000 mL (0 mLs Intravenous Stopped 01/31/17 2120)  dextrose 50 % solution 50 mL (50 mLs Intravenous Given 01/31/17 2119)  sodium polystyrene (KAYEXALATE) 15 GM/60ML suspension 50 g (50 g Rectal Given 01/31/17 2308)     Initial Impression / Assessment and Plan / ED Course  I have reviewed the triage vital signs and the nursing notes.  Pertinent labs & imaging results that were available during  my care of the patient were reviewed by me and considered in my medical decision  making (see chart for details).     Patient presents with new renal failure.  Unclear exactly how long this is been going on.  Chart review shows a creatinine of 1.46 back in March in care everywhere.  No clear etiology of the renal failure as he does not appear to have any obstruction based on relatively benign CT and no urinary retention.  No recent new medicines.  He is hypertensive, unclear if this is a response or cause.  He does have some mild weakness on the left side, he has a history of stroke and this could be from that or could be pain related as he appears to have new rib fractures on the left.  Overall the patient has multiple concerning findings such as the rib fractures, acute renal failure with a creatinine of 11, and potassium of 6.  His ECG does not show any QRS widening or other concerning findings besides mildly peaked T waves.  He was given IV calcium.  Nephrology and hospitalist consulted and he will be admitted to [facility].  Final Clinical Impressions(s) / ED Diagnoses   Final diagnoses:  Acute renal failure, unspecified acute renal failure type [facility])  Hyperkalemia    ED Discharge Orders    None       Sherwood Gambler, MD 01/31/17 2351

## 2017-01-31 NOTE — ED Triage Notes (Addendum)
Pt comes from [facility] via EMS after exhibiting signs of AMS since having a fall on Saturday.  Son at bedside states that the AMS is common for him but the limits in mobility is not.  Pt was seen at [facility] on Saturday after fall and was diagnosed with a kidney contusion.  Pt does have stage III renal disease but does not get dialysis at this time.  Reports from facility showed creatinine of 9.33 and BUN of 104. Pt was hypertensive in route in the 200's.  CBG 157.  EKG showed signs of hyperkalemia.  Pt usually walks with walker independently but has now gotten to where he can not get up. [place].  A&O to self only.

## 2017-02-01 ENCOUNTER — Inpatient Hospital Stay (HOSPITAL_COMMUNITY): Payer: Medicare Other

## 2017-02-01 ENCOUNTER — Other Ambulatory Visit: Payer: Self-pay

## 2017-02-01 DIAGNOSIS — Z8673 Personal history of transient ischemic attack (TIA), and cerebral infarction without residual deficits: Secondary | ICD-10-CM

## 2017-02-01 DIAGNOSIS — N183 Chronic kidney disease, stage 3 (moderate): Secondary | ICD-10-CM

## 2017-02-01 DIAGNOSIS — I639 Cerebral infarction, unspecified: Secondary | ICD-10-CM

## 2017-02-01 DIAGNOSIS — E875 Hyperkalemia: Secondary | ICD-10-CM

## 2017-02-01 DIAGNOSIS — N179 Acute kidney failure, unspecified: Secondary | ICD-10-CM

## 2017-02-01 DIAGNOSIS — D696 Thrombocytopenia, unspecified: Secondary | ICD-10-CM

## 2017-02-01 DIAGNOSIS — I10 Essential (primary) hypertension: Secondary | ICD-10-CM

## 2017-02-01 LAB — RENAL FUNCTION PANEL
ALBUMIN: 2.9 g/dL — AB (ref 3.5–5.0)
Anion gap: 12 (ref 5–15)
BUN: 119 mg/dL — AB (ref 6–20)
CO2: 22 mmol/L (ref 22–32)
CREATININE: 11.13 mg/dL — AB (ref 0.61–1.24)
Calcium: 8.2 mg/dL — ABNORMAL LOW (ref 8.9–10.3)
Chloride: 106 mmol/L (ref 101–111)
GFR calc Af Amer: 4 mL/min — ABNORMAL LOW (ref >60)
GFR, EST NON AFRICAN AMERICAN: 3 mL/min — AB (ref >60)
GLUCOSE: 134 mg/dL — AB (ref 65–99)
PHOSPHORUS: 4.9 mg/dL — AB (ref 2.5–4.6)
Potassium: 5.8 mmol/L — ABNORMAL HIGH (ref 3.5–5.1)
SODIUM: 140 mmol/L (ref 135–145)

## 2017-02-01 LAB — CREATININE, URINE, RANDOM: Creatinine, Urine: 52.62 mg/dL

## 2017-02-01 LAB — CBC
HCT: 36.1 % — ABNORMAL LOW (ref 39.0–52.0)
Hemoglobin: 12.3 g/dL — ABNORMAL LOW (ref 13.0–17.0)
MCH: 32.4 pg (ref 26.0–34.0)
MCHC: 34.1 g/dL (ref 30.0–36.0)
MCV: 95 fL (ref 78.0–100.0)
PLATELETS: 72 10*3/uL — AB (ref 150–400)
RBC: 3.8 MIL/uL — AB (ref 4.22–5.81)
RDW: 14.4 % (ref 11.5–15.5)
WBC: 13.5 10*3/uL — ABNORMAL HIGH (ref 4.0–10.5)

## 2017-02-01 LAB — SODIUM, URINE, RANDOM: SODIUM UR: 70 mmol/L

## 2017-02-01 LAB — MRSA PCR SCREENING: MRSA BY PCR: NEGATIVE

## 2017-02-01 LAB — GLUCOSE, CAPILLARY: GLUCOSE-CAPILLARY: 118 mg/dL — AB (ref 65–99)

## 2017-02-01 MED ORDER — LATANOPROST 0.005 % OP SOLN
1.0000 [drp] | Freq: Every day | OPHTHALMIC | Status: DC
  Administered 2017-02-01 (×2): 1 [drp] via OPHTHALMIC
  Filled 2017-02-01: qty 2.5

## 2017-02-01 MED ORDER — SODIUM CHLORIDE 0.45 % IV SOLN
INTRAVENOUS | Status: DC
  Administered 2017-02-01 – 2017-02-02 (×2): via INTRAVENOUS

## 2017-02-01 NOTE — Progress Notes (Signed)
[facility] Progress Note  Subjective: pt lethargic, not responding to questions  Vitals:   01/31/17 2323 02/01/17 0017 02/01/17 0603 02/01/17 1145  BP: (!) 183/165 (!) 191/119 (!) 154/90 (!) 146/81  Pulse: 83 91 84 76  Resp: 18 16 16 20   Temp:  99.2 F (37.3 C) 98.5 F (36.9 C) 98.1 F (36.7 C)  TempSrc:  Oral Oral Axillary  SpO2: 100% 93% 96% 94%  Weight:  77.8 kg (171 lb 8.3 oz)    Height:  5\' 10"  (1.778 m)      Inpatient medications: . amLODipine  2.5 mg Oral Daily  . atorvastatin  20 mg Oral Daily  . ezetimibe  10 mg Oral Daily  . latanoprost  1 drop Both Eyes QHS   . sodium chloride 100 mL/hr at 02/01/17 7591   acetaminophen **OR** acetaminophen (TYLENOL) oral liquid 160 mg/5 mL **OR** acetaminophen  Exam: General appearance - pale, lethargic, poorly responsive Mouth - clear Neck - adenopathy noted PCL, no JVD Lymphatics - posterior cervical nodes Chest - scattered rhonchi Heart - S1 and S2 normal, systolic murmur MB8/4 at 2nd left intercostal space Abdomen - pos bs, bruise on L , liver down 4 cm, soft Extremities - support stocking, 2+ LLE edema, 1+ R pretib edema Skin - normal coloration and turgor, no rashes, no suspicious skin lesions noted    Assessment: 1 AKI suspect subacute.  No obstruct on CT but question prostate Ca related?Marland Kitchen  Most prominent in differential dx, toxic related to meds, AIN, AGN, or TMA (low platelets), or dysproteinemia.  No clear cut reason for ATN.  K+ up, acid/base ok.  Son not sure wants RRT at all, and wants conservative measures while considers. Not candidate long term at all.  Work up in progress. Have d/w son, will see how he does w/ medical Rx another 24 hrs.  Sig edema LE's, BP's up, will decrease  IVF's and get CXR.   2 CKD 3 suspect HTN. Baseline creat 1.4 from March 2018 3 Hypertension: mod 4 Anemia not an issue 5  Metabolic Bone Disease no indic to eval 6 CVA with L sided weakness chronic 7 Dementia - lives  at SNF 8 DNR   P - await serologies, Kayex prn, supportive care    Kelly Splinter MD [facility] pager [phone]   02/01/2017, 12:15 PM   Recent Labs  Lab 01/31/17 1909 01/31/17 1950 02/01/17 0429  NA 134* 134* 140  K 6.2* 6.0* 5.8*  CL 96* 100* 106  CO2 24  --  22  GLUCOSE 110* 109* 134*  BUN 121* 118* 119*  CREATININE 10.68* 11.10* 11.13*  CALCIUM 8.6*  --  8.2*  PHOS  --   --  4.9*   Recent Labs  Lab 01/31/17 1909 02/01/17 0429  AST 45*  --   ALT 36  --   ALKPHOS 99  --   BILITOT 1.9*  --   PROT 6.4*  --   ALBUMIN 3.3* 2.9*   Recent Labs  Lab 01/31/17 1909 01/31/17 1950 02/01/17 0429  WBC 15.6*  --  13.5*  NEUTROABS 12.5*  --   --   HGB 13.5 13.6 12.3*  HCT 39.5 40.0 36.1*  MCV 95.6  --  95.0  PLT 76*  --  72*   Iron/TIBC/Ferritin/ %Sat No results found for: IRON, TIBC, FERRITIN, IRONPCTSAT

## 2017-02-01 NOTE — Progress Notes (Signed)
SLP Cancellation Note  Patient Details Name: George Rosato Sr. MRN: [id number] DOB: 1921/12/07   Cancelled treatment:       Reason Eval/Treat Not Completed: Patient at procedure or test/unavailable   Germain Osgood 02/01/2017, 2:38 PM  Germain Osgood, M.A. CCC-SLP [phone]

## 2017-02-01 NOTE — Clinical Social Work Note (Signed)
Clinical Social Work Assessment  Patient Details  Name: George Beem Sr. MRN: [id number] Date of Birth: 14-Apr-1921  Date of referral:  02/01/17               Reason for consult:  (pt admitted from facility)                Permission sought to share information with:  Family Supports, Chartered certified accountant granted to share information::  Yes, Verbal Permission Granted  Name::     son Thayne  Agency::  [facility]  Relationship::     Contact Information:     Housing/Transportation Living arrangements for the past 2 months:  [place] of Information:  Adult Children Patient Interpreter Needed:  None Criminal Activity/Legal Involvement Pertinent to Current Situation/Hospitalization:  No - Comment as needed Significant Relationships:  Warehouse manager, Adult Children Lives with:  Facility Resident Do you feel safe going back to the place where you live?  Yes Need for family participation in patient care:  No (Coment)  Care giving concerns:  Pt from [facility] where he is a long term resident. Lived in independent living there since 2014 then moved to SNF building in march 2018 after having a stroke and requiring more care.  At baseline ambulates with walker- was using wheelchair during past few days per son due to weakness. Eats independently, requires some help with ADLs   Social Worker assessment / plan:  CSW consulted as pt is from facility- [facility]. Plans to return at DC. PT consulted- per son pt has been at baseline re: ambulation since last receiving therapy in march 2018. Is open to getting therapy at SNF again if recommended.  CSW left voicemail for facility and will follow and assist with transition back to [facility] at DC (inlcuding updated FL2 once more of pt's DC care needs known).  Employment status:  Retired Forensic scientist:  Medicare PT Recommendations:  Not assessed at this time Information /  Referral to community resources:  [place]  Patient/Family's Response to care:  Pt sleeping but son expressed appreciation for care.   Patient/Family's Understanding of and Emotional Response to Diagnosis, Current Treatment, and Prognosis:  Pt sleeping. Son expressed adequate understanding of pt's current treatment and plans. Was good historian re: his father's care needs and was emotionally appropriate.   Emotional Assessment Appearance:  Appears stated age Attitude/Demeanor/Rapport:  (sleeping) Affect (typically observed):  (sleeping) Orientation:  (UTA) Alcohol / Substance use:  Not Applicable Psych involvement (Current and /or in the community):  No (Comment)  Discharge Needs  Concerns to be addressed:  No discharge needs identified Readmission within the last 30 days:  No Current discharge risk:  None Barriers to Discharge:  Continued Medical Work up   [employer], LCSW 02/01/2017, 11:37 AM  [phone]

## 2017-02-01 NOTE — Progress Notes (Signed)
To MRI

## 2017-02-01 NOTE — Progress Notes (Signed)
[facility] PROGRESS NOTE  George Salamone Sr. FYB:[id number] DOB: 20-Dec-1921 DOA: 01/31/2017 PCP: Patient, No Pcp Per  Interim summary and HPI 83 y.o. male with history of hypertension, prostate cancer, stroke in March of this year which left patient with mild left-sided weakness and at that time follow-up with cardiologist showed unremarkable echocardiogram had a fall 2 days ago and was taken to [facility].  As per the son who provided most of the history CT head was done and discharged back to skilled nursing facility.  Since discharge patient has been feeling tired and also has been having more weakness on the left upper extremity than usual.  Labs were done in the nursing home which showed markedly increased creatinine of 9.9 and patient's creatinine was 1.6 in March of this year.  Patient was referred to the ER for further evaluation and treatment. In ED found with Acute on chronic renal failure and hyperkalemia.  Assessment/Plan: Acute on chronic renal failure -Cr use for baseline comparison was 1.6 (March 2018) and at that time his GFR categorized him for stage 3. -no obstruction seen on CT scan -patient with poor urine output and Cr continue to rise -renal service is on board and will follow rec's; suspicious is for AIN or AGN. -CK was normal -avoid nephrotoxic agents  Thrombocytopenia -appears to be new -LDH is elevated  -no signs of acute bleeding present -will hold plavix and hold heparin products  -??TTP -will order peripheral smear   HLD -continue Zetia and Lipitor  HTN -stable and well controlled -continue norvasc  Hyperkalemia -Due to ARF -continue PRN kayexalate -continue telemetry monitoring  -K 5.8 today  Hx of stroke: with left residual weakness; complaining of feeling more weak. -no acute abnormalities seen on CT head -MRI demonstrating acute multiple right infarction affecting basal ganglia, regional white matter and occipital  area; all of them non-hemorrhagic. -holding plavix given low platelets  -Discussed with neurology, appears to be shower of emboli  -if family would like to pursuit stroke work up will need to be transferred to [facility].  Left side ribs fracture: from recent fall -continue PRN pain meds; but careful with dose and frequency given renal function.  Code Status: DNR Family Communication: Son at bedside  Disposition Plan: to be determined; patient remains inpatient. Will continue IVF's at adjusted rate and follow work up initiated/recommended by nephrology. Son is more inclined to pursuit symptomatic management and avoid invasive treatment. Will follow clinical response.   Consultants:  Renal service   Procedures:  See below for x-ray reports   Antibiotics:  None   HPI/Subjective: Afebrile, no CP, no SOB. Patient is lethargic/somnolent, with difficulties answering questions and/or following commands.   Objective: Vitals:   02/01/17 0603 02/01/17 1145  BP: (!) 154/90 (!) 146/81  Pulse: 84 76  Resp: 16 20  Temp: 98.5 F (36.9 C) 98.1 F (36.7 C)  SpO2: 96% 94%    Intake/Output Summary (Last 24 hours) at 02/01/2017 1728 Last data filed at 02/01/2017 1600 Gross per 24 hour  Intake 3497.5 ml  Output 150 ml  Net 3347.5 ml   Filed Weights   02/01/17 0017  Weight: 77.8 kg (171 lb 8.3 oz)    Exam:   General:  Lethargic/somnolent; having difficulties following commands and answering questions. No major distress appreciated. Per son, patient had a rough night and didn't sleep at all.  Cardiovascular: S1 and S2, no rubs, no gallops  Respiratory: no crackles, good air movement, no wheezing  Abdomen: soft, NT, ND, positive BS  Musculoskeletal: 1-2++ edema bilaterally, no cyanosis, no clubbing  Skin: no rash, no petechiae   Data Reviewed: Basic Metabolic Panel: Recent Labs  Lab 01/31/17 1909 01/31/17 1950 02/01/17 0429  NA 134* 134* 140  K 6.2* 6.0* 5.8*  CL 96*  100* 106  CO2 24  --  22  GLUCOSE 110* 109* 134*  BUN 121* 118* 119*  CREATININE 10.68* 11.10* 11.13*  CALCIUM 8.6*  --  8.2*  PHOS  --   --  4.9*   Liver Function Tests: Recent Labs  Lab 01/31/17 1909 02/01/17 0429  AST 45*  --   ALT 36  --   ALKPHOS 99  --   BILITOT 1.9*  --   PROT 6.4*  --   ALBUMIN 3.3* 2.9*   CBC: Recent Labs  Lab 01/31/17 1909 01/31/17 1950 02/01/17 0429  WBC 15.6*  --  13.5*  NEUTROABS 12.5*  --   --   HGB 13.5 13.6 12.3*  HCT 39.5 40.0 36.1*  MCV 95.6  --  95.0  PLT 76*  --  72*   Cardiac Enzymes: Recent Labs  Lab 01/31/17 2046  CKTOTAL 268   CBG: Recent Labs  Lab 01/31/17 2031 02/01/17 1147  GLUCAP 69 118*    Recent Results (from the past 240 hour(s))  MRSA PCR Screening     Status: None   Collection Time: 02/01/17 12:54 AM  Result Value Ref Range Status   MRSA by PCR NEGATIVE NEGATIVE Final    Comment:        The GeneXpert MRSA Assay (FDA approved for NASAL specimens only), is one component of a comprehensive MRSA colonization surveillance program. It is not intended to diagnose MRSA infection nor to guide or monitor treatment for MRSA infections.      Studies: Ct Abdomen Pelvis Wo Contrast  Result Date: 01/31/2017 CLINICAL DATA:  Patient status post fall on Saturday was diagnosed with kidney contusion. EXAM: CT ABDOMEN AND PELVIS WITHOUT CONTRAST TECHNIQUE: Multidetector CT imaging of the abdomen and pelvis was performed following the standard protocol without IV contrast. COMPARISON:  None. FINDINGS: Lower chest: Mild atelectasis of posterior lung bases are noted. The heart size is mildly enlarged. Hepatobiliary: No focal liver abnormality is seen. No gallstones, gallbladder wall thickening, or biliary dilatation. Pancreas: There is a question low-density lesion measuring 1.3 cm in the tail of pancreas. Pancreas is otherwise unremarkable. Spleen: Normal in size without focal abnormality. Adrenals/Urinary Tract: The  adrenal glands are normal. No focal lesions identified in the kidneys. There is no hydronephrosis bilaterally. Mild bilateral perinephric stranding are identified probably chronic. No definite focal hematoma is identified surrounding the kidneys. The bladder is decompressed with a Foley catheter in place. Stomach/Bowel: There is a moderate hiatal hernia. The stomach is otherwise normal. There is no small bowel obstruction. The colon is normal. The appendix is normal. Vascular/Lymphatic: Aortic atherosclerosis. No enlarged abdominal or pelvic lymph nodes. Reproductive: Prostate is unremarkable. Other: None. Musculoskeletal: Degenerative joint changes of the spine are noted. Scoliosis of spine are noted. Chronic deformity of the pelvic bones are identified. IMPRESSION: No acute abnormality identified in the abdomen pelvis. There is no evidence of hematoma surrounding the bilateral kidneys. Mild chronic bilateral prior nephric stranding are noted. Question 1.3 cm low-density lesion in the tail pancreas. Moderate hiatal hernia. Electronically Signed   By: Abelardo Diesel M.D.   On: 01/31/2017 20:19   Dg Ribs Unilateral W/chest Left  Result Date: 01/31/2017 CLINICAL  DATA:  Altered mental status after a fall 3 days ago. Left-sided chest pain. EXAM: LEFT RIBS AND CHEST - 3+ VIEW COMPARISON:  None. FINDINGS: Left ventricular prominence. Aortic atherosclerosis with calcification and tortuosity. No evidence of heart failure, infiltrate, collapse or effusion. No pneumothorax. Left-sided rib films show what appear to be old healed fractures at ribs 4, 5, 6, 7 and 8 posteriorly. Cannot completely rule out a more recent injury, but largely these look old. No definite acute fracture. IMPRESSION: Burtis Junes old healed rib fractures on the left from ribs 4 through 8. Difficult to completely rule out acute injury, but favor that these are old. Electronically Signed   By: Nelson Chimes M.D.   On: 01/31/2017 20:14   Ct Head Wo  Contrast  Result Date: 01/31/2017 CLINICAL DATA:  Status post fall on Saturday with altered mental status. EXAM: CT HEAD WITHOUT CONTRAST TECHNIQUE: Contiguous axial images were obtained from the base of the skull through the vertex without intravenous contrast. COMPARISON:  None. FINDINGS: Brain: No evidence of acute infarction, hemorrhage, hydrocephalus, extra-axial collection or mass lesion/mass effect. There is chronic diffuse atrophy. Chronic bilateral periventricular white matter small vessel ischemic changes identified. There are old infarcts in bilateral basal ganglia, right occipital lobe, and right cerebellum. Vascular: No hyperdense vessel or unexpected calcification. Skull: Normal. Negative for fracture or focal lesion. Sinuses/Orbits: There is small left maxillary sinus retention cyst. The orbits are normal. Other: None. IMPRESSION: No focal acute intracranial abnormality identified. Old infarcts in bilateral basal ganglia, right occipital lobe, and right cerebellum. Chronic diffuse atrophy. Chronic bilateral periventricular white matter small vessel ischemic changes. Electronically Signed   By: Abelardo Diesel M.D.   On: 01/31/2017 20:10   Mr Brain Wo Contrast  Result Date: 02/01/2017 CLINICAL DATA:  Status post fall. Altered mental status. Lethargy. LEFT-sided weakness. EXAM: MRI HEAD WITHOUT CONTRAST TECHNIQUE: Multiplanar, multiecho pulse sequences of the brain and surrounding structures were obtained without intravenous contrast. COMPARISON:  CT head 01/31/2017. FINDINGS: Brain: Multifocal areas restricted diffusion, corresponding low ADC, affect the basal ganglia and periventricular white matter, extending to posterior limb internal capsule and subcortical insula, all on the RIGHT. Tiny focus of restricted diffusion also involves the medial RIGHT occipital cortex near the temporal occipital junction. Shower of emboli is suspected, given there is no large vessel occlusion. No acute  hemorrhage, mass lesion, or extra-axial fluid. Hydrocephalus ex vacuo. Extreme atrophy. Mild to moderate chronic microvascular ischemic change throughout the white matter. Numerous chronic lacunar infarcts including a moderate-sized insult affecting the RIGHT paramedian pons. Chronic hemorrhagic infarction affects the RIGHT occipital lobe, PCA distribution. Vascular: Flow voids are maintained. Skull and upper cervical spine: Exaggerated cervical lordotic curve. Mild pannus surrounds the odontoid. Grossly normal marrow. No tonsillar herniation. Sinuses/Orbits: Negative orbits.  No sinus fluid. Other: None. IMPRESSION: Multifocal areas of acute infarction are nonhemorrhagic, affecting the basal ganglia and regional white matter, all on the RIGHT. Tiny acute RIGHT occipital infarct. Shower of emboli is suspected. No evidence for large vessel occlusion. Advanced atrophy, small vessel disease, and chronic infarction as described above. Electronically Signed   By: Staci Righter M.D.   On: 02/01/2017 14:59   US Renal  Result Date: 01/31/2017 CLINICAL DATA:  83 year old male with acute renal insufficiency and elevated BUN and creatinine EXAM: RENAL / URINARY TRACT ULTRASOUND COMPLETE COMPARISON:  Abdominal CT dated 01/31/2017 FINDINGS: Evaluation is limited as the patient is not able to cooperate with the exam. Right Kidney: Length: 9.4 cm. There is increased  renal echogenicity. No hydronephrosis or large shadowing stone. Left Kidney: Length: 8.9 cm. The left kidney is very poorly visualized. No hydronephrosis or large shadowing stone. Bladder: The urinary bladder is decompressed around a Foley catheter and not well visualized. IMPRESSION: 1. Limited exam. Mildly echogenic kidneys likely related to underlying medical renal disease. Clinical correlation is recommended. 2. No hydronephrosis. Electronically Signed   By: Anner Crete M.D.   On: 01/31/2017 23:51   Dg Chest Port 1 View  Result Date:  02/01/2017 CLINICAL DATA:  Acute kidney injury EXAM: PORTABLE CHEST 1 VIEW COMPARISON:  01/31/2017 FINDINGS: The heart size and mediastinal contours are within normal limits. Both lungs are clear. The visualized skeletal structures are unremarkable. IMPRESSION: No active disease. Electronically Signed   By: Kathreen Devoid   On: 02/01/2017 14:50    Scheduled Meds: . amLODipine  2.5 mg Oral Daily  . atorvastatin  20 mg Oral Daily  . ezetimibe  10 mg Oral Daily  . latanoprost  1 drop Both Eyes QHS   Continuous Infusions: . sodium chloride 50 mL/hr at 02/01/17 1325    Principal Problem:   Acute renal failure (ARF) (HCC) Active Problems:   Hyperkalemia   Essential hypertension   Thrombocytopenia (HCC)   History of stroke   ARF (acute renal failure) ([place])    Time spent: 30 minutes    Barton Dubois  [facility] Pager [phone]. If 7PM-7AM, please contact night-coverage at [email or web address], password [facility] 02/01/2017, 5:28 PM  LOS: 1 day

## 2017-02-01 NOTE — Progress Notes (Signed)
PT Cancellation Note  Patient Details Name: George Proby Sr. MRN: [id number] DOB: 1921/08/06   Cancelled Treatment:    Reason Eval/Treat Not Completed: Medical issues which prohibited therapy;Fatigue/lethargy limiting ability to participate(pt very lethargic, difficult to arouse 2* abnormal lab values per RN. Pt's son requested PT hold until tomorrow. Will follow. )   Philomena Doheny 02/01/2017, 12:01 PM [phone]

## 2017-02-01 NOTE — Plan of Care (Signed)
  Progressing Clinical Measurements: Respiratory complications will improve 02/01/2017 0105 - Progressing by Talbert Forest, RN Cardiovascular complication will be avoided 02/01/2017 0105 - Progressing by Talbert Forest, RN Coping: Level of anxiety will decrease 02/01/2017 0105 - Progressing by Talbert Forest, RN Pain Managment: General experience of comfort will improve 02/01/2017 0105 - Progressing by Talbert Forest, RN Safety: Ability to remain free from injury will improve 02/01/2017 0105 - Progressing by Talbert Forest, RN Skin Integrity: Risk for impaired skin integrity will decrease 02/01/2017 0105 - Progressing by Talbert Forest, RN

## 2017-02-02 DIAGNOSIS — I639 Cerebral infarction, unspecified: Secondary | ICD-10-CM

## 2017-02-02 DIAGNOSIS — Z7189 Other specified counseling: Secondary | ICD-10-CM

## 2017-02-02 DIAGNOSIS — Z515 Encounter for palliative care: Secondary | ICD-10-CM

## 2017-02-02 LAB — RENAL FUNCTION PANEL
ALBUMIN: 3 g/dL — AB (ref 3.5–5.0)
Anion gap: 15 (ref 5–15)
BUN: 141 mg/dL — ABNORMAL HIGH (ref 6–20)
CALCIUM: 8.1 mg/dL — AB (ref 8.9–10.3)
CO2: 21 mmol/L — AB (ref 22–32)
CREATININE: 12.91 mg/dL — AB (ref 0.61–1.24)
Chloride: 102 mmol/L (ref 101–111)
GFR, EST AFRICAN AMERICAN: 3 mL/min — AB (ref >60)
GFR, EST NON AFRICAN AMERICAN: 3 mL/min — AB (ref >60)
Glucose, Bld: 102 mg/dL — ABNORMAL HIGH (ref 65–99)
PHOSPHORUS: 6.6 mg/dL — AB (ref 2.5–4.6)
Potassium: 5.5 mmol/L — ABNORMAL HIGH (ref 3.5–5.1)
SODIUM: 138 mmol/L (ref 135–145)

## 2017-02-02 LAB — CBC
HCT: 35.1 % — ABNORMAL LOW (ref 39.0–52.0)
Hemoglobin: 11.8 g/dL — ABNORMAL LOW (ref 13.0–17.0)
MCH: 32 pg (ref 26.0–34.0)
MCHC: 33.6 g/dL (ref 30.0–36.0)
MCV: 95.1 fL (ref 78.0–100.0)
Platelets: 106 10*3/uL — ABNORMAL LOW (ref 150–400)
RBC: 3.69 MIL/uL — AB (ref 4.22–5.81)
RDW: 14.3 % (ref 11.5–15.5)
WBC: 13.6 10*3/uL — ABNORMAL HIGH (ref 4.0–10.5)

## 2017-02-02 LAB — URINE CULTURE: Culture: NO GROWTH

## 2017-02-02 LAB — HAPTOGLOBIN
HAPTOGLOBIN: 14 mg/dL — AB (ref 34–200)
Haptoglobin: <10 mg/dL — ABNORMAL LOW (ref 34–200)

## 2017-02-02 LAB — PROTEIN ELECTROPHORESIS, SERUM
A/G Ratio: 1.1 (ref 0.7–1.7)
Albumin ELP: 3 g/dL (ref 2.9–4.4)
Alpha-1-Globulin: 0.4 g/dL (ref 0.0–0.4)
Alpha-2-Globulin: 0.4 g/dL (ref 0.4–1.0)
Beta Globulin: 1 g/dL (ref 0.7–1.3)
GAMMA GLOBULIN: 0.9 g/dL (ref 0.4–1.8)
Globulin, Total: 2.7 g/dL (ref 2.2–3.9)
TOTAL PROTEIN ELP: 5.7 g/dL — AB (ref 6.0–8.5)

## 2017-02-02 LAB — KAPPA/LAMBDA LIGHT CHAINS
KAPPA FREE LGHT CHN: 108.2 mg/L — AB (ref 3.3–19.4)
KAPPA, LAMDA LIGHT CHAIN RATIO: 2.08 — AB (ref 0.26–1.65)
Lambda free light chains: 52 mg/L — ABNORMAL HIGH (ref 5.7–26.3)

## 2017-02-02 LAB — ANTISTREPTOLYSIN O TITER: ASO: 34 IU/mL (ref 0.0–200.0)

## 2017-02-02 LAB — C3 COMPLEMENT: C3 COMPLEMENT: 124 mg/dL (ref 82–167)

## 2017-02-02 LAB — C4 COMPLEMENT: COMPLEMENT C4, BODY FLUID: 37 mg/dL (ref 14–44)

## 2017-02-02 LAB — ANTINUCLEAR ANTIBODIES, IFA: ANTINUCLEAR ANTIBODIES, IFA: NEGATIVE

## 2017-02-02 MED ORDER — SODIUM CHLORIDE 0.9 % IV SOLN
250.0000 mg | Freq: Two times a day (BID) | INTRAVENOUS | Status: DC
  Administered 2017-02-02: 250 mg via INTRAVENOUS
  Filled 2017-02-02 (×2): qty 2
  Filled 2017-02-02: qty 4

## 2017-02-02 MED ORDER — LORAZEPAM 1 MG PO TABS: 1.0000 mg | ORAL_TABLET | Freq: Three times a day (TID) | ORAL | Status: AC | PRN

## 2017-02-02 MED ORDER — HYDROCODONE-ACETAMINOPHEN 7.5-325 MG/15ML PO SOLN: 10.0000 mL | Freq: Four times a day (QID) | ORAL | Status: AC | PRN

## 2017-02-02 NOTE — Care Management Note (Signed)
Case Management Note  Patient Details  Name: George Fitzwater Sr. MRN: [id number] Date of Birth: 1921-07-01  Subjective/Objective:  83 y/o m admitted w/ARF. From SNF-[facility]. DNR. Palliative following-full comfort-residential hospice-CSW following.                  Action/Plan:d/c plan residential hospice.   Expected Discharge Date:  (unknown)               Expected Discharge Plan:  [place]  In-House Referral:  Clinical Social Work  Discharge planning Services  CM Consult  Post Acute Care Choice:    Choice offered to:     DME Arranged:    DME Agency:     HH Arranged:    [place] Agency:     Status of Service:  In process, will continue to follow  If discussed at Long Length of Stay Meetings, dates discussed:    Additional Comments:  Dessa Phi, RN 02/02/2017, 12:05 PM

## 2017-02-02 NOTE — Progress Notes (Signed)
OT Cancellation Note  Patient Details Name: George Pettet Sr. MRN: [id number] DOB: 1921/04/06   Cancelled Treatment:    Reason Eval/Treat Not Completed: Other (comment)  OT to sign off. Decision made this morning for full comfort care only and d/c to residential hospice per palliative team note.  Kari Baars, [place]    Payton Mccallum D 02/02/2017, 12:49 PM

## 2017-02-02 NOTE — Progress Notes (Signed)
[facility]: Met with pt and family- discussed hospice services and confirmed intrest in [facility]. Discussed service agreement with Hospice care and they are in agreement to pursuing comfort care at [facility]. Reviewed case with Medical Director and she did approve pt for Hospice care. Offer the bed to family and they agreed.  Notified Meghan MSW at [facility] and she will proceed to work on MD discharge and also set up ambulance services. Winfield Cunas RN

## 2017-02-02 NOTE — Progress Notes (Signed)
PT Cancellation Note  Patient Details Name: George Petrosky Sr. MRN: [id number] DOB: 1921-06-16   Cancelled Treatment:    Reason Eval/Treat Not Completed: Other (comment) Palliative MD requested to hold therapy at this time and going to speak with pt's son at this moment.   Burle Kwan,KATHrine E 02/02/2017, 10:36 AM Carmelia Bake, PT, DPT 02/02/2017 Pager: [phone]

## 2017-02-02 NOTE — Consult Note (Addendum)
Consultation Note Date: 02/02/2017   Patient Name: George Lanzer Sr.  DOB: 07/07/1921  MRN: [id number]  Age / Sex: 83 y.o., male  PCP: Patient, No Pcp Per Referring Physician: Barton Dubois, MD  Reason for Consultation: Establishing goals of care  HPI/Patient Profile: 83 y.o. male   admitted on 01/31/2017   83 year old woman with a past medical history significant for having had a stroke in March of this year, has been a resident of [place] for 5 years now, initially was in the independent living area at [facility], has been in skilled level of care with gradual progressive cognitive and functional decline since March of this year after his stroke. 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 Patient's wife is deceased. Has only 1 son George Hall who is present at the bedside.  It is noted that the patient had confusion weakness and falls a few days ago, was taken to local emergency department for evaluation, had ongoing symptoms and was brought in at [facility] long hospital after blood work revealed acute kidney injury. MRI of the brain here shows several areas of acute infarcts, showering of emboli. Patient remains weak and confused.  Palliative consultation requested for goals of care discussions.  Clinical Assessment and Goals of Care:   Patient is resting in bed. His eyes are closed. He is able to answer yes/no questions appropriately. He does not think he is in pain. At times, he is noted to be grimacing. He is restless at times.  Family meeting with son outside the room. I introduced myself and palliative care as follows: Palliative medicine is specialized medical care for people living with serious illness. It focuses on providing relief from the symptoms and stress of a serious illness. The goal is to improve quality of life for both the patient and the family.  Goals wishes and  values discussed, patient's son is tearful and states that his dad has been confused and progressively getting weaker for the past several months. He is appreciative of the information he has received from hospital medicine and nephrology physicians in this hospitalization.  Son reports that the patient has made a living will indicating DO NOT RESUSCITATE. At end-of-life, the patient has reportedly made his wishes clear that he did not want to be on tubes and machines.   We discussed about the patient's current hospitalization. The patient has severe acute kidney injury going on. The patient has worsening blood work. The patient's son is appreciative of Dr. Jonnie Finner from the renal service taking the time to discuss with the son about worsening blood work pertaining to declining kidney function. Patient has ongoing uremia and hence a poor prognosis.   Additionally, we discussed about the results of MRI of the brain. Patient has severe atrophy, evidence of multiple infarcts.    We discussed about hospice philosophy of care. We discussed about comfort measures only. Disposition options discussed. Patient's son was initially asking about the patient going back at her [facility] with hospice services following. We  discussed that option in detail. Additionally, residential hospice option was also discussed in detail. In the end, the patient's son is agreeable to residential hospice in [facility].   HCPOA  son George Hall is next of kin, only child, patient's wife is deceased.   SUMMARY OF RECOMMENDATIONS   DNR DNI NO Dialysis NO PEG tube Comfort measures only Residential hospice Son prefers hospice home in [place] CSW consult to help facilitate  Code Status/Advance Care Planning:  DNR    Symptom Management:    as above   Palliative Prophylaxis:   Delirium Protocol  Additional Recommendations (Limitations, Scope, Preferences):  Full Comfort Care  Psycho-social/Spiritual:   Desire for  further Chaplaincy support:yes  Additional Recommendations: Education on Hospice  Prognosis:   < 2 weeks  Discharge Planning: Hospice facility      Primary Diagnoses: Present on Admission: . Acute renal failure (ARF) ([place]) . Hyperkalemia . Essential hypertension . Thrombocytopenia ([place]) . ARF (acute renal failure) ([place])   I have reviewed the medical record, interviewed the patient and family, and examined the patient. The following aspects are pertinent.  Past Medical History:  Diagnosis Date  . Anemia   . Cancer [facility])    Prostate  . Complication of anesthesia    trouble with anethesia when had prostate ca surgery - problem unknown  . Constipation   . Edema   . Glaucoma   . Incontinence   . Inguinal hernia   . Peripheral vascular disease ([place])   . Renal disorder    Stage III  . Stroke [facility])    left sided weakness   Social History   Socioeconomic History  . Marital status: Widowed    Spouse name: None  . Number of children: None  . Years of education: None  . Highest education level: None  Social Needs  . Financial resource strain: None  . Food insecurity - worry: None  . Food insecurity - inability: None  . Transportation needs - medical: None  . Transportation needs - non-medical: None  Occupational History  . None  Tobacco Use  . Smoking status: Never Smoker  . Smokeless tobacco: Never Used  Substance and Sexual Activity  . Alcohol use: No    Frequency: Never  . Drug use: No  . Sexual activity: No  Other Topics Concern  . None  Social History Narrative  . None   Family History  Problem Relation Age of Onset  . Hypertension Other    Scheduled Meds: . amLODipine  2.5 mg Oral Daily  . atorvastatin  20 mg Oral Daily  . ezetimibe  10 mg Oral Daily  . latanoprost  1 drop Both Eyes QHS   Continuous Infusions: . sodium chloride 50 mL/hr at 02/02/17 1023  . methylPREDNISolone (SOLU-MEDROL) injection     PRN Meds:.acetaminophen **OR**  acetaminophen (TYLENOL) oral liquid 160 mg/5 mL **OR** acetaminophen Medications Prior to Admission:  Prior to Admission medications   Medication Sig Start Date End Date Taking? Authorizing Provider  acetaminophen (TYLENOL) 325 MG tablet Take 650 mg every 4 (four) hours as needed by mouth for fever.    Yes [provider]  amLODipine (NORVASC) 2.5 MG tablet Take 2.5 mg daily by mouth.    Yes [provider]  ascorbic acid (VITAMIN C) 250 MG tablet Take 250 mg daily by mouth.    Yes [provider]  atorvastatin (LIPITOR) 20 MG tablet Take 20 mg daily by mouth.   Yes [provider]  calcium  carbonate (OSCAL) 1500 (600 Ca) MG TABS tablet Take 1,500 mg daily with breakfast by mouth.   Yes [provider]  clopidogrel (PLAVIX) 75 MG tablet Take 75 mg daily by mouth.  05/27/16  Yes [provider]  Coenzyme Q-10 200 MG CAPS Take 200 mg daily by mouth.    Yes [provider]  ezetimibe (ZETIA) 10 MG tablet Take 10 mg by mouth. 02/18/16  Yes [provider]  gabapentin (NEURONTIN) 300 MG capsule Take 300-600 mg 2 (two) times daily by mouth. Take 2 capsules (600 mg) by mouth at midday and Take 1 capsule (300 mg) at bedtime   Yes [provider]  Nyoka Cowden Tea 150 MG CAPS Take 315 mg daily by mouth.    Yes [provider]  Multiple Vitamin (MULTI-VITAMINS) TABS Take 1 tablet daily by mouth.  01/18/16  Yes [provider]  polyethylene glycol (MIRALAX / GLYCOLAX) packet Take 17 g daily by mouth.    Yes [provider]  primidone (MYSOLINE) 50 MG tablet Take 50 mg 2 (two) times daily by mouth.    Yes [provider]  sodium chloride (OCEAN) 0.65 % nasal spray 1 spray by Right Nare route as needed for congestion.   Yes [provider]  travoprost, benzalkonium, (TRAVATAN) 0.004 % ophthalmic solution Administer 1 drop to both eyes nightly.   Yes [provider]  zinc oxide 20 %  ointment Apply 1 application 2 (two) times daily as needed topically for irritation.    Yes [provider]   Allergies  Allergen Reactions  . Demerol [Meperidine]   . Nsaids   . Simvastatin   . Statins    Review of Systems +weakness +confusion  Physical Exam Weak, pale, lethargic, poorly responsive Coarse scattered rhonchi S1-S2 Abdomen is mildly distended Has 2+ lower extremity edema worse on the left side Confused  Vital Signs: BP (!) 177/87 (BP Location: Right Arm)   Pulse 86   Temp 98.1 F (36.7 C) (Oral)   Resp 16   Ht 5\' 10"  (1.778 m)   Wt 77.8 kg (171 lb 8.3 oz)   SpO2 92%   BMI 24.61 kg/m  Pain Assessment: No/denies pain   Pain Score: 0-No pain   SpO2: SpO2: 92 % O2 Device:SpO2: 92 % O2 Flow Rate: .   IO: Intake/output summary:   Intake/Output Summary (Last 24 hours) at 02/02/2017 1056 Last data filed at 02/02/2017 0616 Gross per 24 hour  Intake 1644.17 ml  Output 305 ml  Net 1339.17 ml    palliative performance scale 20%  LBM: Last BM Date: (patient unsure when last BM was) Baseline Weight: Weight: 77.8 kg (171 lb 8.3 oz) Most recent weight: Weight: 77.8 kg (171 lb 8.3 oz)     Palliative Assessment/Data:     Time In:  10.09 Time Out:  11.09 Time Total:  60 min  Greater than 50%  of this time was spent counseling and coordinating care related to the above assessment and plan.  Signed by: Loistine Chance, MD  [phone]  Please contact Palliative Medicine Team phone at [phone] for questions and concerns.  For individual provider: See Shea Evans

## 2017-02-02 NOTE — Progress Notes (Signed)
[facility] Progress Note  Subjective: pt more alert today, interacting minimally.  Choked on some food this am.  MRI brain showing multifocal infarcts R side, possible shower per report.  CXR is clear. 300 cc UOP yesterday.     Vitals:   02/01/17 2300 02/02/17 0210 02/02/17 0602 02/02/17 1011  BP:  (!) 172/89 (!) 175/87 (!) 177/87  Pulse:  73 75 86  Resp:  14 16   Temp: 99.5 F (37.5 C) 98.8 F (37.1 C) 98.1 F (36.7 C)   TempSrc:  Tympanic Oral   SpO2:  92% 94% 92%  Weight:      Height:        Inpatient medications: . amLODipine  2.5 mg Oral Daily  . atorvastatin  20 mg Oral Daily  . ezetimibe  10 mg Oral Daily  . latanoprost  1 drop Both Eyes QHS   . sodium chloride 50 mL/hr at 02/02/17 1023   acetaminophen **OR** acetaminophen (TYLENOL) oral liquid 160 mg/5 mL **OR** acetaminophen  Exam: General appearance - pale, lethargic, poorly responsive Mouth - clear Neck - adenopathy noted PCL, no JVD Lymphatics - posterior cervical nodes Chest - scattered rhonchi Heart - S1 and S2 normal, systolic murmur SH7/0 at 2nd left intercostal space Abdomen - pos bs, bruise on L , liver down 4 cm, soft Extremities - support stocking, 2+ LLE edema, 1+ R pretib edema Skin - normal coloration and turgor, no rashes, no suspicious skin lesions noted  Urine sediment 11/15 (undersigned) > diffuse RBC's many dysmorphic, numerous cellular / granular mixed casts with RBC/ WBC casts.    Assessment: 1 AKI suspect subacute.  No obstruct on CT.  Haptoglobin and plts were both low, possible TMA.  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  Not a candidate for immunosupression or pheresis.  Advanced ,severe subacute AKI w/ uremia and poor prognosis. Have d/w son, we can try empiric IV steroids - not sure it will make a difference but he would like to try something. Met w/ son and primary MD, pall care consult ordered.   2 CKD 3 suspect HTN. Baseline creat 1.4 from March  2018 3 Hypertension: mod 4 Anemia not an issue 5  Metabolic Bone Disease no indic to eval 6 CVA with L sided weakness chronic 7 Dementia - lives at SNF 8 DNR   P - as above    Kelly Splinter MD [place] pager [phone]   02/02/2017, 10:34 AM   Recent Labs  Lab 01/31/17 1909 01/31/17 1950 02/01/17 0429 02/02/17 0827  NA 134* 134* 140 138  K 6.2* 6.0* 5.8* 5.5*  CL 96* 100* 106 102  CO2 24  --  22 21*  GLUCOSE 110* 109* 134* 102*  BUN 121* 118* 119* 141*  CREATININE 10.68* 11.10* 11.13* 12.91*  CALCIUM 8.6*  --  8.2* 8.1*  PHOS  --   --  4.9* 6.6*   Recent Labs  Lab 01/31/17 1909 02/01/17 0429 02/02/17 0827  AST 45*  --   --   ALT 36  --   --   ALKPHOS 99  --   --   BILITOT 1.9*  --   --   PROT 6.4*  --   --   ALBUMIN 3.3* 2.9* 3.0*   Recent Labs  Lab 01/31/17 1909 01/31/17 1950 02/01/17 0429 02/02/17 0827  WBC 15.6*  --  13.5* 13.6*  NEUTROABS 12.5*  --   --   --  HGB 13.5 13.6 12.3* 11.8*  HCT 39.5 40.0 36.1* 35.1*  MCV 95.6  --  95.0 95.1  PLT 76*  --  72* 106*   Iron/TIBC/Ferritin/ %Sat No results found for: IRON, TIBC, FERRITIN, IRONPCTSAT

## 2017-02-02 NOTE — Discharge Summary (Signed)
Physician Discharge Summary  George Amezcua Sr. QQV:[id number] DOB: [birth year] DOA: 01/31/2017  PCP: Patient, No Pcp Per  Admit date: 01/31/2017 Discharge date: 02/02/2017  Time spent: 35 minutes  Recommendations for Outpatient Follow-up:  1. Full comfort care   Discharge Diagnoses:  Principal Problem:   Acute renal failure (ARF) ([place]) Active Problems:   Hyperkalemia   Essential hypertension   Thrombocytopenia (H[facility])   History of stroke   ARF (acute renal failure) ([place])   Palliative care by specialist   Goals of care, counseling/discussion   Acute embolic stroke [facility])   Discharge Condition: stable and comfortable. Patient to be discharge to [facility] residential hospice for further care, symptomatic management and transition to full comfort.  Diet recommendation: comfort feeding   Filed Weights   02/01/17 0017  Weight: 77.8 kg (171 lb 8.3 oz)    History of present illness:   83 y.o.malewithhistory of hypertension, prostate cancer, stroke in March of this year which left patient with mild left-sided weakness and at that time follow-up with cardiologist showed unremarkable echocardiogram had a fall 2 days ago and was taken to [facility]. As per the son who provided most of the history CT head was done and discharged back to skilled nursing facility.Since discharge patient has been feeling tired and also has been having more weakness on the left upper extremity than usual. Labs were done in the nursing home which showed markedly increased creatinine of 9.9 and patient's creatinine was 1.6 in March of this year. Patient was referred to the ER for further evaluation and treatment. In ED found with Acute on chronic renal failure and hyperkalemia.    Hospital Course:  Acute on chronic renal failure -Cr81 use for baseline comparison was 1.6 (March 2018) and at that time his GFR categorized him for stage 3. -no obstruction seen on CT scan -patient  continue to have poor urine output and Cr continue to rise -renal service was on board and essentially recommended transition to full comfort care and hospice -Palliative care was consulted and family decided to make him and full comfort care -will transfer to [facility] residential hospice for symptomatic management and end of life care. -CK was normal -started on PRN ativan and vicodin solution   Thrombocytopenia -appears to be new -LDH was elevated  -no signs of acute bleeding present -will hold plavix and given transition to hospice and full comfort care, no further labs or work up anticipated. -??TTP  HLD -given transition to full comfort and hospice will discontinue statins and cholesterol meds.  HTN -stable and well controlled -continue norvasc; which would also help with control of HR  Hyperkalemia -Due to ARF -K 5.5 after kayexalate and bicarbonate  -after further discussion and given further worsening of renal function; decision made for full comfort care and transition to hospice. -no further labs or meds planned.   Hx of stroke: with left residual weakness; complaining of feeling more weak on the left side and having mild difficulty with swallowing.positive acute shower embolic non-hemorrhagic stroke on his right cerebral cortex appreciated. -no acute abnormalities seen on CT head -MRI demonstrating acute multiple right infarction affecting basal ganglia, regional white matter and occipital area; all of them non-hemorrhagic. -holding plavix given low platelets and decision for comfort care -Discussed with neurology, appears to be shower of emboli; they recommended comfort care as well.  Left side ribs fracture: from recent fall -continue PRN pain meds  Procedures:  See below for x-ray reports  Consultations:  Palliative care  Renal service   Neurology curbside  Discharge Exam: Vitals:   02/02/17 1011 02/02/17 1454  BP: (!) 177/87 (!) 168/78   Pulse: 86 87  Resp:    Temp:  98.7 F (37.1 C)  SpO2: 92% 94%     General:  more alert today; but overall confused and with poor insight. deneies CP and SOB. Patient reported no nausea, no vomiting; but expressed not feeling well.   Cardiovascular: S1 and S2, no rubs, no gallops  Respiratory: no crackles, good air movement, no wheezing   Abdomen: soft, NT, ND, positive BS  Musculoskeletal: 1-2++ edema bilaterally, no cyanosis, no clubbing  Skin: no rash, no petechiae    Discharge Instructions   Discharge Instructions    Discharge instructions   Complete by:  As directed    Comfort care, symptomatic management and end of life transition.     Current Discharge Medication List    START taking these medications   Details  HYDROcodone-acetaminophen (HYCET) 7.5-325 mg/15 ml solution Take 10 mLs every 6 (six) hours as needed by mouth for severe pain (comfort).    LORazepam (ATIVAN) 1 MG tablet Take 1 tablet (1 mg total) every 8 (eight) hours as needed by mouth for anxiety.      CONTINUE these medications which have NOT CHANGED   Details  acetaminophen (TYLENOL) 325 MG tablet Take 650 mg every 4 (four) hours as needed by mouth for fever.     amLODipine (NORVASC) 2.5 MG tablet Take 2.5 mg daily by mouth.     ascorbic acid (VITAMIN C) 250 MG tablet Take 250 mg daily by mouth.     polyethylene glycol (MIRALAX / GLYCOLAX) packet Take 17 g daily by mouth.     primidone (MYSOLINE) 50 MG tablet Take 50 mg 2 (two) times daily by mouth.     sodium chloride (OCEAN) 0.65 % nasal spray 1 spray by Right Nare route as needed for congestion.    travoprost, benzalkonium, (TRAVATAN) 0.004 % ophthalmic solution Administer 1 drop to both eyes nightly.    zinc oxide 20 % ointment Apply 1 application 2 (two) times daily as needed topically for irritation.       STOP taking these medications     atorvastatin (LIPITOR) 20 MG tablet      calcium carbonate (OSCAL) 1500 (600 Ca) MG TABS  tablet      clopidogrel (PLAVIX) 75 MG tablet      Coenzyme Q-10 200 MG CAPS      ezetimibe (ZETIA) 10 MG tablet      gabapentin (NEURONTIN) 300 MG capsule      Green Tea 150 MG CAPS      Multiple Vitamin (MULTI-VITAMINS) TABS        Allergies  Allergen Reactions  . Demerol [Meperidine]   . Nsaids   . Simvastatin   . Statins     The results of significant diagnostics from this hospitalization (including imaging, microbiology, ancillary and laboratory) are listed below for reference.    Significant Diagnostic Studies: Ct Abdomen Pelvis Wo Contrast  Result Date: 01/31/2017 CLINICAL DATA:  Patient status post fall on Saturday was diagnosed with kidney contusion. EXAM: CT ABDOMEN AND PELVIS WITHOUT CONTRAST TECHNIQUE: Multidetector CT imaging of the abdomen and pelvis was performed following the standard protocol without IV contrast. COMPARISON:  None. FINDINGS: Lower chest: Mild atelectasis of posterior lung bases are noted. The heart size is mildly enlarged. Hepatobiliary: No focal liver abnormality is seen. No  gallstones, gallbladder wall thickening, or biliary dilatation. Pancreas: There is a question low-density lesion measuring 1.3 cm in the tail of pancreas. Pancreas is otherwise unremarkable. Spleen: Normal in size without focal abnormality. Adrenals/Urinary Tract: The adrenal glands are normal. No focal lesions identified in the kidneys. There is no hydronephrosis bilaterally. Mild bilateral perinephric stranding are identified probably chronic. No definite focal hematoma is identified surrounding the kidneys. The bladder is decompressed with a Foley catheter in place. Stomach/Bowel: There is a moderate hiatal hernia. The stomach is otherwise normal. There is no small bowel obstruction. The colon is normal. The appendix is normal. Vascular/Lymphatic: Aortic atherosclerosis. No enlarged abdominal or pelvic lymph nodes. Reproductive: Prostate is unremarkable. Other: None.  Musculoskeletal: Degenerative joint changes of the spine are noted. Scoliosis of spine are noted. Chronic deformity of the pelvic bones are identified. IMPRESSION: No acute abnormality identified in the abdomen pelvis. There is no evidence of hematoma surrounding the bilateral kidneys. Mild chronic bilateral prior nephric stranding are noted. Question 1.3 cm low-density lesion in the tail pancreas. Moderate hiatal hernia. Electronically Signed   By: Abelardo Diesel M.D.   On: 01/31/2017 20:19   Dg Ribs Unilateral W/chest Left  Result Date: 01/31/2017 CLINICAL DATA:  Altered mental status after a fall 3 days ago. Left-sided chest pain. EXAM: LEFT RIBS AND CHEST - 3+ VIEW COMPARISON:  None. FINDINGS: Left ventricular prominence. Aortic atherosclerosis with calcification and tortuosity. No evidence of heart failure, infiltrate, collapse or effusion. No pneumothorax. Left-sided rib films show what appear to be old healed fractures at ribs 4, 5, 6, 7 and 8 posteriorly. Cannot completely rule out a more recent injury, but largely these look old. No definite acute fracture. IMPRESSION: Burtis Junes old healed rib fractures on the left from ribs 4 through 8. Difficult to completely rule out acute injury, but favor that these are old. Electronically Signed   By: Nelson Chimes M.D.   On: 01/31/2017 20:14   Ct Head Wo Contrast  Result Date: 01/31/2017 CLINICAL DATA:  Status post fall on Saturday with altered mental status. EXAM: CT HEAD WITHOUT CONTRAST TECHNIQUE: Contiguous axial images were obtained from the base of the skull through the vertex without intravenous contrast. COMPARISON:  None. FINDINGS: Brain: No evidence of acute infarction, hemorrhage, hydrocephalus, extra-axial collection or mass lesion/mass effect. There is chronic diffuse atrophy. Chronic bilateral periventricular white matter small vessel ischemic changes identified. There are old infarcts in bilateral basal ganglia, right occipital lobe, and right  cerebellum. Vascular: No hyperdense vessel or unexpected calcification. Skull: Normal. Negative for fracture or focal lesion. Sinuses/Orbits: There is small left maxillary sinus retention cyst. The orbits are normal. Other: None. IMPRESSION: No focal acute intracranial abnormality identified. Old infarcts in bilateral basal ganglia, right occipital lobe, and right cerebellum. Chronic diffuse atrophy. Chronic bilateral periventricular white matter small vessel ischemic changes. Electronically Signed   By: Abelardo Diesel M.D.   On: 01/31/2017 20:10   Mr Brain Wo Contrast  Result Date: 02/01/2017 CLINICAL DATA:  Status post fall. Altered mental status. Lethargy. LEFT-sided weakness. EXAM: MRI HEAD WITHOUT CONTRAST TECHNIQUE: Multiplanar, multiecho pulse sequences of the brain and surrounding structures were obtained without intravenous contrast. COMPARISON:  CT head 01/31/2017. FINDINGS: Brain: Multifocal areas restricted diffusion, corresponding low ADC, affect the basal ganglia and periventricular white matter, extending to posterior limb internal capsule and subcortical insula, all on the RIGHT. Tiny focus of restricted diffusion also involves the medial RIGHT occipital cortex near the temporal occipital junction. Shower of emboli is  suspected, given there is no large vessel occlusion. No acute hemorrhage, mass lesion, or extra-axial fluid. Hydrocephalus ex vacuo. Extreme atrophy. Mild to moderate chronic microvascular ischemic change throughout the white matter. Numerous chronic lacunar infarcts including a moderate-sized insult affecting the RIGHT paramedian pons. Chronic hemorrhagic infarction affects the RIGHT occipital lobe, PCA distribution. Vascular: Flow voids are maintained. Skull and upper cervical spine: Exaggerated cervical lordotic curve. Mild pannus surrounds the odontoid. Grossly normal marrow. No tonsillar herniation. Sinuses/Orbits: Negative orbits.  No sinus fluid. Other: None. IMPRESSION:  Multifocal areas of acute infarction are nonhemorrhagic, affecting the basal ganglia and regional white matter, all on the RIGHT. Tiny acute RIGHT occipital infarct. Shower of emboli is suspected. No evidence for large vessel occlusion. Advanced atrophy, small vessel disease, and chronic infarction as described above. Electronically Signed   By: Staci Righter M.D.   On: 02/01/2017 14:59   US Renal  Result Date: 01/31/2017 CLINICAL DATA:  83 year old male with acute renal insufficiency and elevated BUN and creatinine EXAM: RENAL / URINARY TRACT ULTRASOUND COMPLETE COMPARISON:  Abdominal CT dated 01/31/2017 FINDINGS: Evaluation is limited as the patient is not able to cooperate with the exam. Right Kidney: Length: 9.4 cm. There is increased renal echogenicity. No hydronephrosis or large shadowing stone. Left Kidney: Length: 8.9 cm. The left kidney is very poorly visualized. No hydronephrosis or large shadowing stone. Bladder: The urinary bladder is decompressed around a Foley catheter and not well visualized. IMPRESSION: 1. Limited exam. Mildly echogenic kidneys likely related to underlying medical renal disease. Clinical correlation is recommended. 2. No hydronephrosis. Electronically Signed   By: Anner Crete M.D.   On: 01/31/2017 23:51   Dg Chest Port 1 View  Result Date: 02/01/2017 CLINICAL DATA:  Acute kidney injury EXAM: PORTABLE CHEST 1 VIEW COMPARISON:  01/31/2017 FINDINGS: The heart size and mediastinal contours are within normal limits. Both lungs are clear. The visualized skeletal structures are unremarkable. IMPRESSION: No active disease. Electronically Signed   By: Kathreen Devoid   On: 02/01/2017 14:50    Microbiology: Recent Results (from the past 240 hour(s))  Urine culture     Status: None   Collection Time: 01/31/17  7:13 PM  Result Value Ref Range Status   Specimen Description URINE, CATHETERIZED  Final   Special Requests NONE  Final   Culture   Final    NO GROWTH Performed at  [facility], [address] [place], [place] [address]    Report Status 02/02/2017 FINAL  Final  MRSA PCR Screening     Status: None   Collection Time: 02/01/17 12:54 AM  Result Value Ref Range Status   MRSA by PCR NEGATIVE NEGATIVE Final    Comment:        The GeneXpert MRSA Assay (FDA approved for NASAL specimens only), is one component of a comprehensive MRSA colonization surveillance program. It is not intended to diagnose MRSA infection nor to guide or monitor treatment for MRSA infections.      Labs: Basic Metabolic Panel: Recent Labs  Lab 01/31/17 1909 01/31/17 1950 02/01/17 0429 02/02/17 0827  NA 134* 134* 140 138  K 6.2* 6.0* 5.8* 5.5*  CL 96* 100* 106 102  CO2 24  --  22 21*  GLUCOSE 110* 109* 134* 102*  BUN 121* 118* 119* 141*  CREATININE 10.68* 11.10* 11.13* 12.91*  CALCIUM 8.6*  --  8.2* 8.1*  PHOS  --   --  4.9* 6.6*   Liver Function Tests: Recent Labs  Lab 01/31/17 1909 02/01/17 0429 02/02/17 0827  AST 45*  --   --   ALT 36  --   --   ALKPHOS 99  --   --   BILITOT 1.9*  --   --   PROT 6.4*  --   --   ALBUMIN 3.3* 2.9* 3.0*   CBC: Recent Labs  Lab 01/31/17 1909 01/31/17 1950 02/01/17 0429 02/02/17 0827  WBC 15.6*  --  13.5* 13.6*  NEUTROABS 12.5*  --   --   --   HGB 13.5 13.6 12.3* 11.8*  HCT 39.5 40.0 36.1* 35.1*  MCV 95.6  --  95.0 95.1  PLT 76*  --  72* 106*   Cardiac Enzymes: Recent Labs  Lab 01/31/17 2046  CKTOTAL 268    CBG: Recent Labs  Lab 01/31/17 2031 02/01/17 1147  GLUCAP 69 118*    Signed:  Barton Dubois MD.  [facility] 02/02/2017, 2:57 PM

## 2017-02-02 NOTE — Progress Notes (Signed)
PT Cancellation Note / SCREEN  Patient Details Name: George Klemp Sr. MRN: [id number] DOB: 1922/02/27   Cancelled Treatment:    Reason Eval/Treat Not Completed: PT screened, no needs identified, will sign off  Decision made this morning for full comfort care only and d/c to residential hospice per palliative team note.  Please reorder if new needs arise.  PT to sign off.  George Hall,KATHrine E 02/02/2017, 12:20 PM Carmelia Bake, PT, DPT 02/02/2017 Pager: [phone]

## 2017-02-02 NOTE — Progress Notes (Signed)
CSW following for disposition. Pt admitted from [facility] where he is a long term resident. Following extensive discussion with palliative team today, has decided to pursue full comfort care and residential hospice. Spoke with pt's son- preference is for [facility]. CSW made referral and will follow and assist.  Sharren Bridge, MSW, LCSW Clinical Social Work 02/02/2017 [phone]

## 2017-02-02 NOTE — Progress Notes (Signed)
Pt discharged and will admit to [facility] for residential hospice care. Report # [phone] [facility] transportation arranged.  Family at bedside agreeable to plan and completed paperwork with hospice.  Sharren Bridge, MSW, LCSW Clinical Social Work 02/02/2017 [phone]

## 2017-02-02 NOTE — Progress Notes (Signed)
SLP Cancellation Note  Patient Details Name: George Grider Sr. MRN: [id number] DOB: 1921-05-26   Cancelled treatment:          Spoke to MD who advised to discontinue orders as pt now full comfort.    Macario Golds 02/02/2017, 11:44 AM

## 2017-02-02 NOTE — Care Management Note (Signed)
Case Management Note  Patient Details  Name: George Hall. MRN: [id number] Date of Birth: 11/22/1921  Subjective/Objective:                    Action/Plan:d/c residential hospice.   Expected Discharge Date:  02/02/17               Expected Discharge Plan:  [place]  In-House Referral:  Clinical Social Work  Discharge planning Services  CM Consult  Post Acute Care Choice:    Choice offered to:     DME Arranged:    DME Agency:     HH Arranged:    [place] Agency:     Status of Service:  Completed, signed off  If discussed at [employer], dates discussed:    Additional Comments:  Dessa Phi, RN 02/02/2017, 3:16 PM

## 2017-02-18 DEATH — deceased

## 2019-04-12 IMAGING — CT CT HEAD W/O CM
3 of 4 series · 15 of 47 positions shown, 18 images · non-contrast
Comparison: None.

CLINICAL DATA: Status post fall on [REDACTED] with altered mental
status.

EXAM:
CT HEAD WITHOUT CONTRAST
TECHNIQUE: Contiguous axial images were obtained from the base of the skull
through the vertex without intravenous contrast.

[Series 3: head w/o · axial · non-contrast · 0.45mm/px · z∈[-143,-23]mm · 9 of 31 slices shown, 12 images]
[im 4/31  brain]
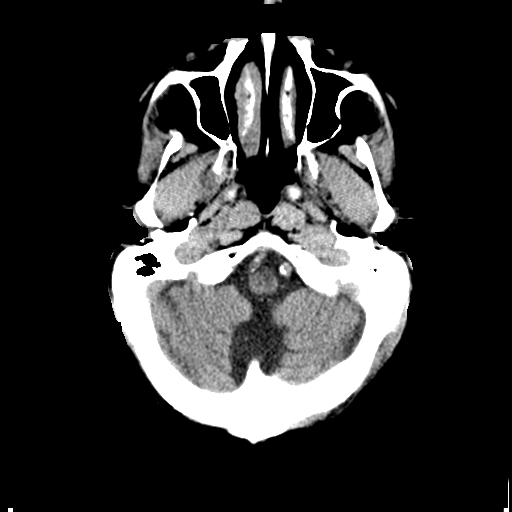
[im 4/31  bone]
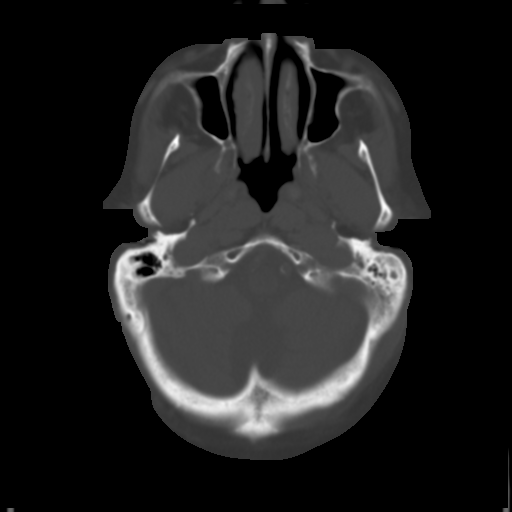
[im 7/31  brain]
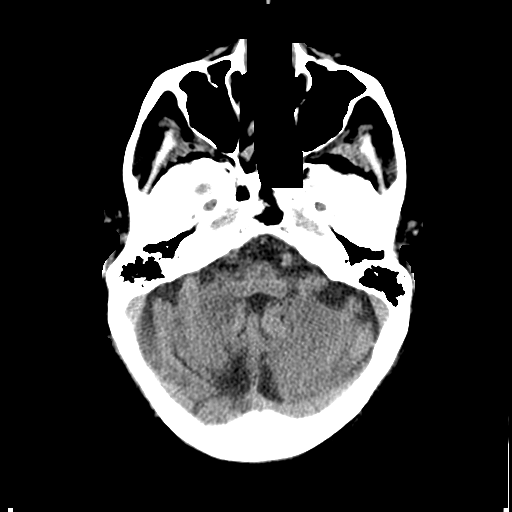
[im 10/31  brain]
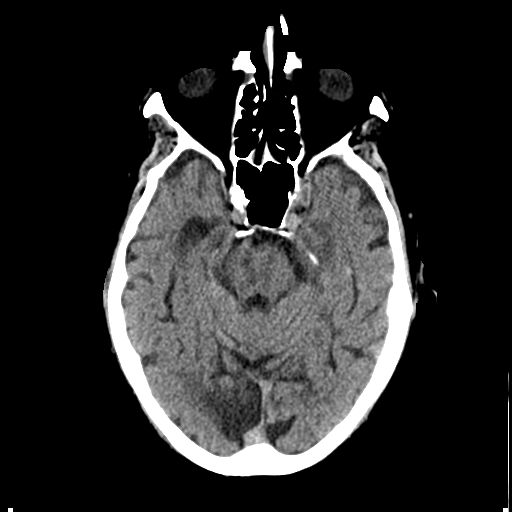
[im 13/31  brain]
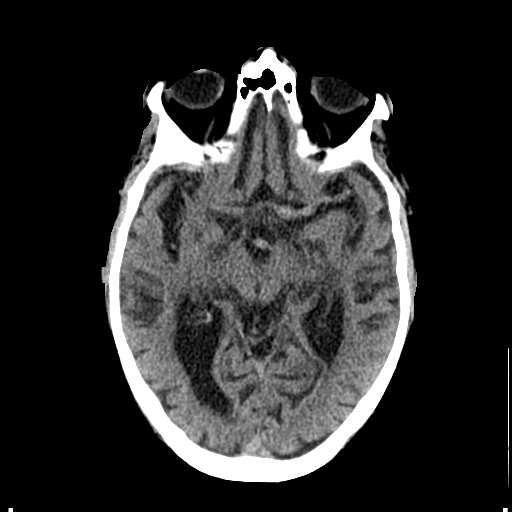
[im 16/31  brain]
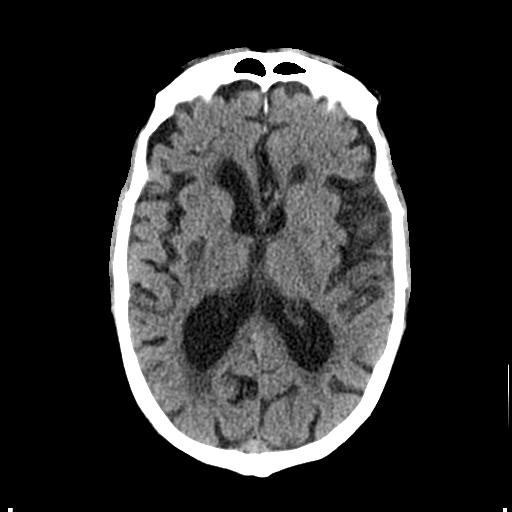
[im 16/31  bone]
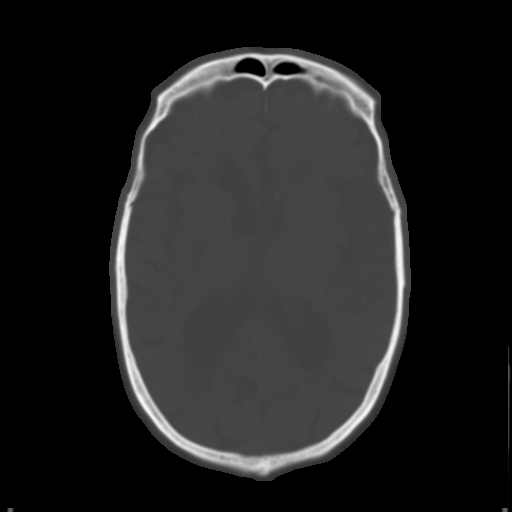
[im 19/31  brain]
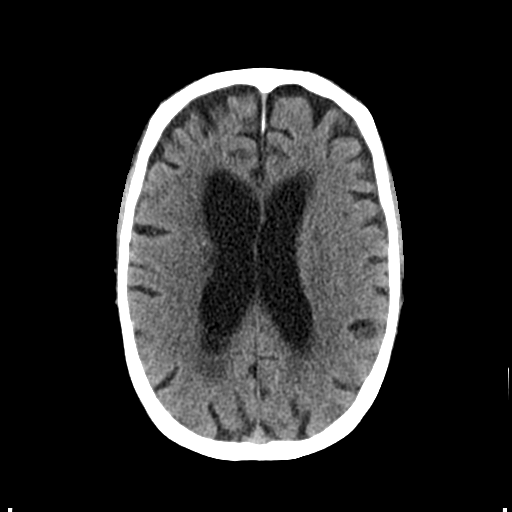
[im 22/31  brain]
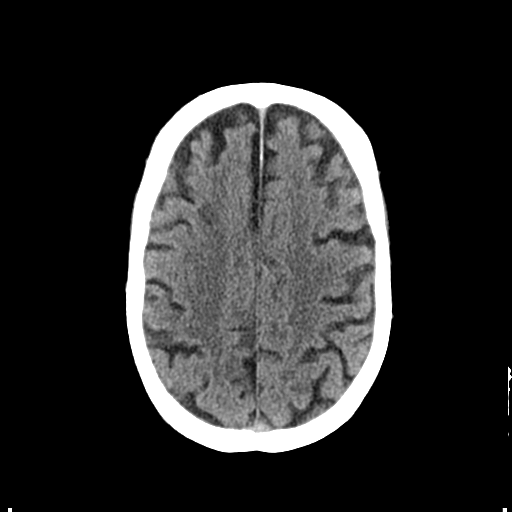
[im 25/31  brain]
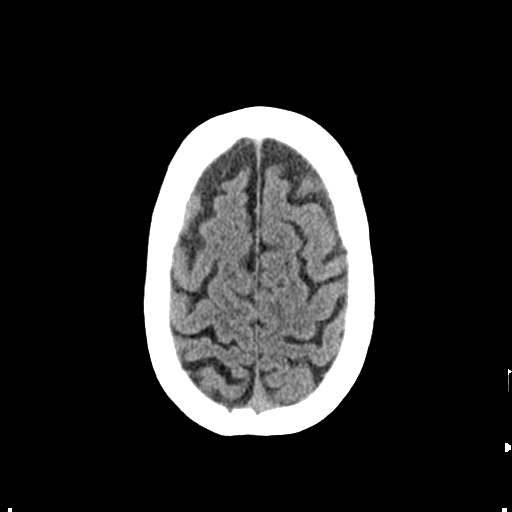
[im 28/31  brain]
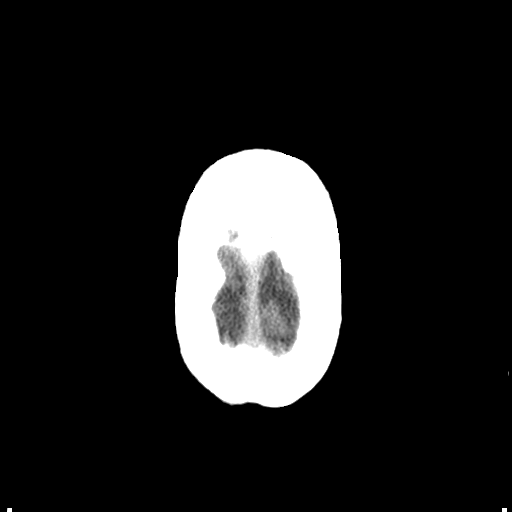
[im 28/31  bone]
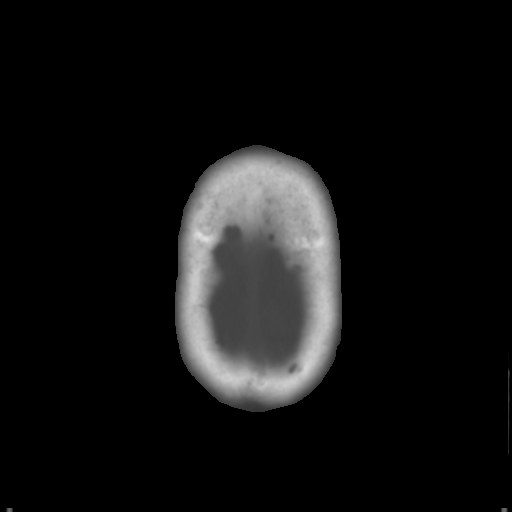

[Series 5: coronal · coronal · 0.30mm/px · 3 of 66 slices shown]
[im 22/66  brain]
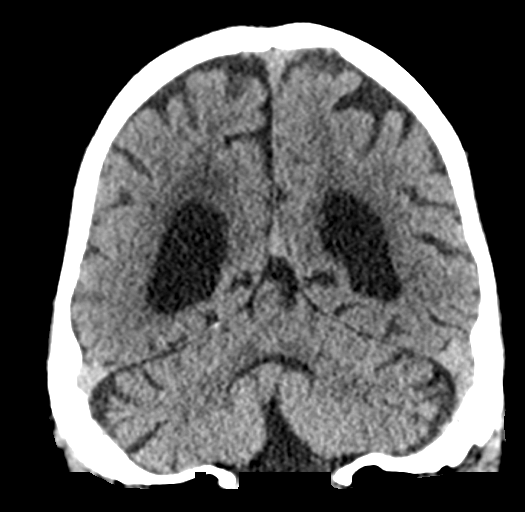
[im 29/66  brain]
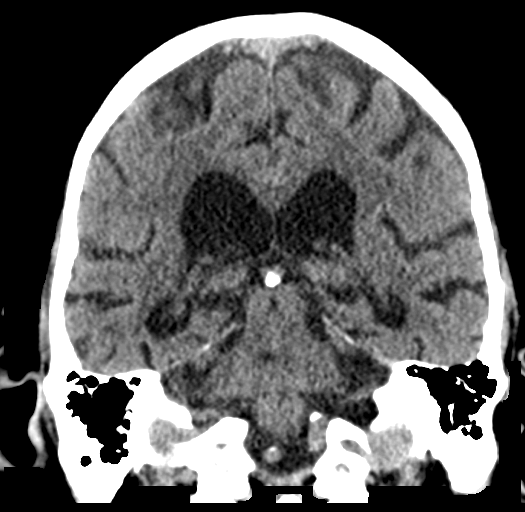
[im 37/66  brain]
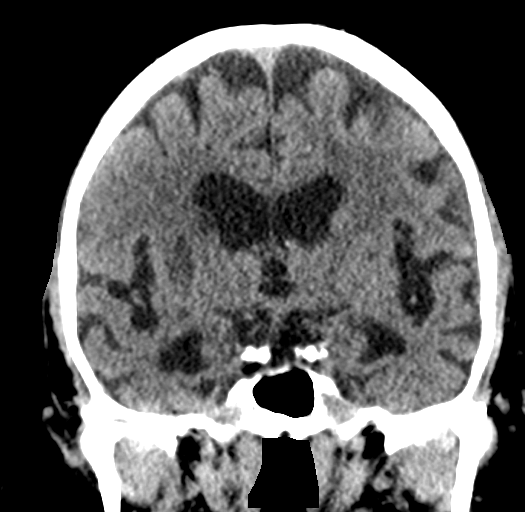

[Series 6: sagittal · sagittal · 0.34mm/px · 3 of 48 slices shown]
[im 16/48  brain]
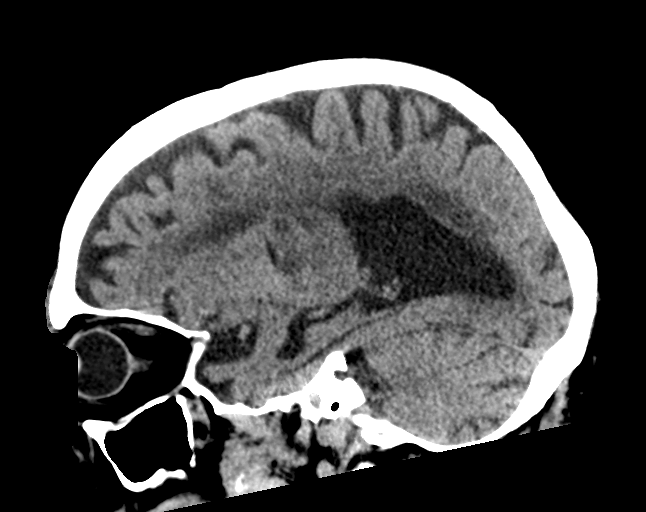
[im 24/48  brain]
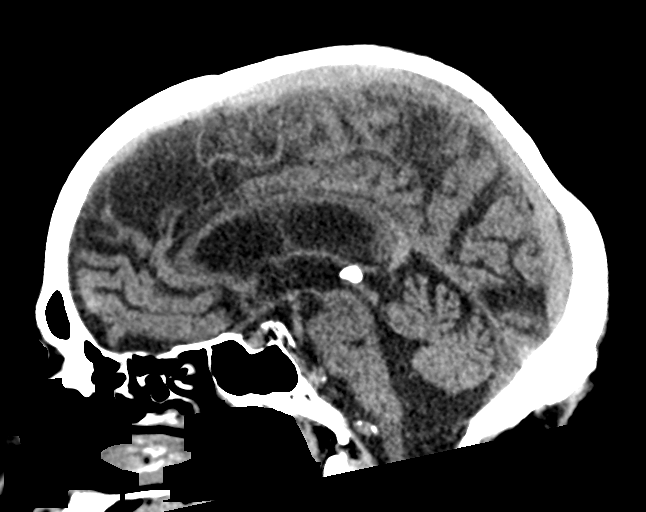
[im 32/48  brain]
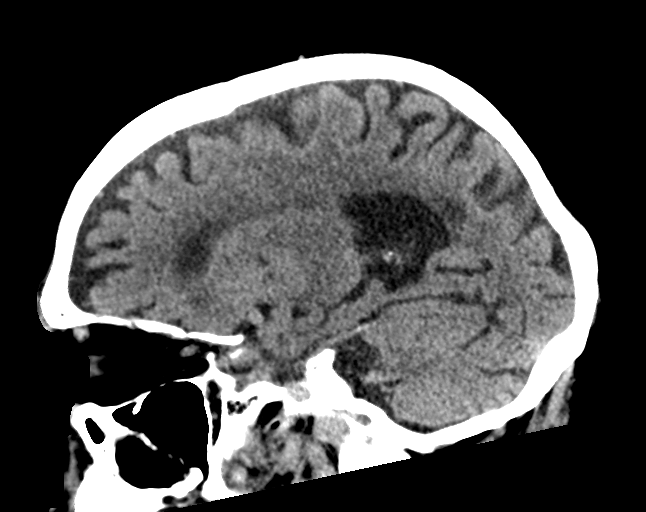

[15 of 47 positions shown; findings below may reference images not displayed]

FINDINGS: Brain: No evidence of acute infarction, hemorrhage, hydrocephalus,
extra-axial collection or mass lesion/mass effect. There is chronic
diffuse atrophy. Chronic bilateral periventricular white matter
small vessel ischemic changes identified. There are old infarcts in
bilateral basal ganglia, right occipital lobe, and right cerebellum.

Vascular: No hyperdense vessel or unexpected calcification.

Skull: Normal. Negative for fracture or focal lesion.

Sinuses/Orbits: There is small left maxillary sinus retention cyst.
The orbits are normal.

Other: None.
IMPRESSION: No focal acute intracranial abnormality identified.

Old infarcts in bilateral basal ganglia, right occipital lobe, and
right cerebellum.

Chronic diffuse atrophy. Chronic bilateral periventricular white
matter small vessel ischemic changes.
